# Patient Record
Sex: Male | Born: 1985
Health system: Southern US, Community
[De-identification: ages and names within clinical notes are randomized; demographics above are authoritative.]

## PROBLEM LIST (undated history)

## (undated) DIAGNOSIS — R55 Syncope and collapse: Secondary | ICD-10-CM

## (undated) DIAGNOSIS — K219 Gastro-esophageal reflux disease without esophagitis: Secondary | ICD-10-CM

## (undated) DIAGNOSIS — M549 Dorsalgia, unspecified: Secondary | ICD-10-CM

## (undated) HISTORY — DX: Syncope and collapse: R55

## (undated) HISTORY — PX: LEG SURGERY: SHX1003

## (undated) HISTORY — PX: WISDOM TOOTH EXTRACTION: SHX21

## (undated) HISTORY — DX: Gastro-esophageal reflux disease without esophagitis: K21.9

---
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
## 2012-10-10 ENCOUNTER — Telehealth: Payer: Self-pay | Admitting: General Practice

## 2012-10-10 NOTE — Telephone Encounter (Signed)
Wants to set up appt with you for next week concerning back pain. Please advice patient would like a call back from you if possible.

## 2012-10-10 NOTE — Telephone Encounter (Signed)
I can see him on 10/19/12 at 11:30.  Let me know if anything needs to be done before that appt.

## 2012-10-10 NOTE — Telephone Encounter (Signed)
Pt called for Dr. Lorin Picket nurse did not say what he needed.

## 2012-10-11 ENCOUNTER — Telehealth: Payer: Self-pay | Admitting: Internal Medicine

## 2012-10-11 NOTE — Telephone Encounter (Signed)
Made appointment. Left message on patient cell to let him know his appointment time and date

## 2012-10-11 NOTE — Telephone Encounter (Signed)
Pt aware of appointment 12/5

## 2012-10-11 NOTE — Telephone Encounter (Signed)
Left message for patient to return call.

## 2012-10-19 ENCOUNTER — Ambulatory Visit (INDEPENDENT_AMBULATORY_CARE_PROVIDER_SITE_OTHER): Payer: BC Managed Care – PPO | Admitting: Internal Medicine

## 2012-10-19 ENCOUNTER — Encounter: Payer: Self-pay | Admitting: Internal Medicine

## 2012-10-19 VITALS — BP 118/62 | HR 68 | Temp 98.8°F | Ht 72.0 in | Wt 265.2 lb

## 2012-10-19 DIAGNOSIS — M549 Dorsalgia, unspecified: Secondary | ICD-10-CM

## 2012-10-19 MED ORDER — ETODOLAC 400 MG PO TABS: 400.0000 mg | ORAL_TABLET | Freq: Two times a day (BID) | ORAL | Status: DC | PRN

## 2012-10-22 ENCOUNTER — Encounter: Payer: Self-pay | Admitting: Internal Medicine

## 2012-10-22 NOTE — Progress Notes (Signed)
  Subjective:    Patient ID: Ian Pineda, male    DOB: 12/20/1985, 26 y.o.   MRN: [id number]  HPI 25 year old male who comes in today as a work in with concerns regarding right side back pain.  Intermittently problem off and on.  States he woke three weeks ago and back was hurting.  Has been hurting since.  Stretches help.  Taking advil and tylenol prn.  No pain radiating down the legs.  No numbness or tingling.  No urinary or bowel change.    Review of Systems Patient denies any headache, lightheadedness or dizziness.  No chest pain, tightness or palpitations.  No increased shortness of breath, cough or congestion.  No nausea or vomiting.  No abdominal pain or cramping.  No bowel change.  No urine change.        Objective:   Physical Exam Filed Vitals:   10/19/12 1130  BP: 118/62  Pulse: 68  Temp: 98.8 F (55.94 C)   26 year old male in no acute distress. NECK:  Supple, nontender.    HEART:  Appears to be regular. LUNGS:  Without crackles or wheezing audible.  Respirations even and unlabored.  ABDOMEN:  Soft, nontender.  No audible abdominal bruit.   EXTREMITIES:  No increased edema to be present.  MSK.  No back tenderness to palpation.  Negative straight leg raise.  Motor strength equal bilateral lower extremities.  Left paraspinus muscle tightness compared to previous.                      Assessment & Plan:  BACK PAIN.  Felt to be more muscular in origin.  Increased muscle tightness as outlined.  No radicular symptoms.  Advil and tylenol helps some.  Will treat with Lodine 400mg  bid prn.  Hold on xray.  Follow closely.  Will notify me if persistent problems.    GI.  Bowels doing well. Now.  Had colonoscopy.  Obtain results.    ERECTILE DYSFUNCTION.  Intermittent problem.  Have checked testosterone level in the past and was normal per report.  Obtain records.  Works seven days a week most days.  Fatigue may be contributing.  Obtain records from outside to review.   HEALTH  MAINTENANCE.  Obtain outside records for review.  Will get him back in for physical exam.  I spent 25 minutes with him and more than 50% of the time was spent in consultation regarding the above.

## 2013-01-22 ENCOUNTER — Ambulatory Visit: Payer: Self-pay | Admitting: Internal Medicine

## 2013-04-20 ENCOUNTER — Encounter: Payer: BC Managed Care – PPO | Admitting: Internal Medicine

## 2013-06-19 ENCOUNTER — Encounter: Payer: Self-pay | Admitting: Internal Medicine

## 2013-06-19 ENCOUNTER — Ambulatory Visit (INDEPENDENT_AMBULATORY_CARE_PROVIDER_SITE_OTHER): Payer: BC Managed Care – PPO | Admitting: Internal Medicine

## 2013-06-19 ENCOUNTER — Ambulatory Visit (INDEPENDENT_AMBULATORY_CARE_PROVIDER_SITE_OTHER)
Admission: RE | Admit: 2013-06-19 | Discharge: 2013-06-19 | Disposition: A | Discharge status: Home / Self Care | Payer: BC Managed Care – PPO | Source: Ambulatory Visit | Attending: Internal Medicine | Admitting: Internal Medicine

## 2013-06-19 VITALS — BP 110/68 | HR 78 | Temp 98.6°F | Ht 72.25 in | Wt 271.5 lb

## 2013-06-19 DIAGNOSIS — M549 Dorsalgia, unspecified: Secondary | ICD-10-CM

## 2013-06-19 MED ORDER — ETODOLAC 400 MG PO TABS: 400.0000 mg | ORAL_TABLET | Freq: Two times a day (BID) | ORAL | Status: DC | PRN

## 2013-06-20 ENCOUNTER — Encounter: Payer: Self-pay | Admitting: Internal Medicine

## 2013-06-20 NOTE — Progress Notes (Signed)
  Subjective:    Patient ID: Ian Pineda, male    DOB: 28-Oct-1986, 27 y.o.   MRN: [id number]  HPI 27 year old male who comes in today for his complete physical exam.  Still working a lot of hours.  It is some better.  Some fatigue related to this.  Planning to get married in a couple of weeks.  Overall he feels he is doing well except for a recent back flare.  I saw him in 12/13 for back pain.  This improved and resolved, until a recent flare.  Describes as low back pain.  No radiation.  Good posture helps.  Physical therapy and chiropractor visits helped previously.  Worse in the am.  No abdominal pain or urinary symptoms.  Breathing stable.     Past Medical History  Diagnosis Date  . Syncope     one episode worked up (cardiology and endocrinology).  felt to be related to hypoglycemia    Review of Systems Patient denies any headache, lightheadedness or dizziness. No sinus or allergy symptoms.   No chest pain, tightness or palpitations.  No increased shortness of breath, cough or congestion.  No nausea or vomiting.  No abdominal pain or cramping.  No bowel change.  No urine change.  Back pain as outlined.         Objective:   Physical Exam  Filed Vitals:   06/19/13 1507  BP: 110/68  Pulse: 78  Temp: 98.6 F (52 C)   27 year old male in no acute distress.  HEENT:  Nares - clear.  Oropharynx - without lesions. NECK:  Supple.  Nontender.  No audible carotid bruit.  HEART:  Appears to be regular.   LUNGS:  No crackles or wheezing audible.  Respirations even and unlabored.   RADIAL PULSE:  Equal bilaterally.  ABDOMEN:  Soft.  Nontender.  Bowel sounds present and normal.  No audible abdominal bruit.  GU:  Not performed.   EXTREMITIES:  No increased edema present.  DP pulses palpable and equal bilaterally.     BACK:  Non tender to palpation.  Negative straight leg raise.   Pain localized to the right paraspinous muscle.        Assessment & Plan:  BACK PAIN.  Felt to be more muscular  in origin.  Increased muscle tightness as outlined.  No radicular symptoms.   Will treat with Lodine 400mg  bid prn.  This helped previously and he tolerated.  Check L-S spine xray.   Discussed physical therapy.  He discussed chiropractor.  Follow.  Let me know if symptoms persist or do not resolve.    GI.  Bowels doing well. Now.  Had colonoscopy.    HEALTH MAINTENANCE.  Physical today.    I spent 25 minutes with the patient and more than 50% of the time was spent in consultation regarding the above.

## 2013-07-12 ENCOUNTER — Other Ambulatory Visit: Payer: Self-pay | Admitting: Internal Medicine

## 2013-07-26 ENCOUNTER — Telehealth: Payer: Self-pay | Admitting: Internal Medicine

## 2013-07-26 NOTE — Telephone Encounter (Signed)
Need to notify pt that I am seeing pts now and that I have two meetings this evening.   I will probably not be able to call him this pm.  Need to know if anything urgent or problems - or can he wait.

## 2013-07-26 NOTE — Telephone Encounter (Signed)
Called pt and left message on his cell.

## 2013-07-26 NOTE — Telephone Encounter (Signed)
Pt is needing call back. He would not give me any details he just wanted Dr. Lorin Picket to give him a call back ???

## 2013-07-26 NOTE — Telephone Encounter (Signed)
Pt notified & states that it is not urgent. Would be better to call tomorrow after 3:30 (when he gets off work)

## 2013-07-28 NOTE — Telephone Encounter (Signed)
Called and left message on his cell phone.

## 2013-07-29 NOTE — Telephone Encounter (Signed)
Called pt.  Discussed with him regarding problems with erections.  He had no problems on his honeymoon.  When he returned noticed some problems with sustaining erections.  Now having no problems.  We discussed this at length.  Discussed possible etiologies.  Since it is better now, he desires no further w/up at this point.  Follow.  Will notify me if any problems or if he desires further w/up.

## 2013-09-20 ENCOUNTER — Ambulatory Visit (INDEPENDENT_AMBULATORY_CARE_PROVIDER_SITE_OTHER): Payer: BC Managed Care – PPO | Admitting: Internal Medicine

## 2013-09-20 ENCOUNTER — Encounter: Payer: Self-pay | Admitting: Internal Medicine

## 2013-09-20 VITALS — BP 124/78 | HR 64 | Temp 98.4°F | Wt 268.0 lb

## 2013-09-20 DIAGNOSIS — M549 Dorsalgia, unspecified: Secondary | ICD-10-CM

## 2013-09-20 MED ORDER — ETODOLAC 400 MG PO TABS: 400.0000 mg | ORAL_TABLET | Freq: Two times a day (BID) | ORAL | Status: DC | PRN

## 2013-09-20 NOTE — Progress Notes (Signed)
Pre-visit discussion using our clinic review tool. No additional management support is needed unless otherwise documented below in the visit note.  

## 2013-09-23 ENCOUNTER — Encounter: Payer: Self-pay | Admitting: Internal Medicine

## 2013-09-23 NOTE — Progress Notes (Signed)
  Subjective:    Patient ID: Ian Pineda, male    DOB: November 01, 1986, 27 y.o.   MRN: [id number]  HPI 27 year old male who comes in today for a schedule follow up.   Still working a lot of hours.  It is some better.  Some fatigue related to this.  Is married now.   No abdominal pain or urinary symptoms.  Breathing stable.  Back is doing better.  Not requiring Lodine now.  Request to have rx if needed.  The previous problems he was having with sustaining an erection have resolved.  Doing well.     Past Medical History  Diagnosis Date  . Syncope     one episode worked up (cardiology and endocrinology).  felt to be related to hypoglycemia    Review of Systems Patient denies any headache, lightheadedness or dizziness. No sinus or allergy symptoms.   No chest pain, tightness or palpitations.  No increased shortness of breath, cough or congestion.  No nausea or vomiting.  No abdominal pain or cramping.  No bowel change.  No urine change.  Back better.          Objective:   Physical Exam  Filed Vitals:   09/20/13 1600  BP: 124/78  Pulse: 64  Temp: 98.4 F (39.44 C)   27 year old male in no acute distress.  HEENT:  Nares - clear.  Oropharynx - without lesions. NECK:  Supple.  Nontender.  No audible carotid bruit.  HEART:  Appears to be regular.   LUNGS:  No crackles or wheezing audible.  Respirations even and unlabored.   RADIAL PULSE:  Equal bilaterally.  ABDOMEN:  Soft.  Nontender.  Bowel sounds present and normal.  No audible abdominal bruit.      BACK:  Non tender to palpation.  EXTREMITIES:  No increased edema.         Assessment & Plan:  BACK PAIN.  Continue therapy exercises.  Not requiring lodine now.  rx given if needed.  Doing better.     GI.  Bowels doing well. Now.  Had colonoscopy.    HEALTH MAINTENANCE.  Physical last visit.

## 2013-10-02 ENCOUNTER — Telehealth: Payer: Self-pay | Admitting: Internal Medicine

## 2013-10-02 NOTE — Telephone Encounter (Signed)
States pharmacy did not receive rx for etodolac.

## 2013-10-03 ENCOUNTER — Other Ambulatory Visit: Payer: Self-pay | Admitting: *Deleted

## 2013-10-03 MED ORDER — ETODOLAC 400 MG PO TABS: 400.0000 mg | ORAL_TABLET | Freq: Two times a day (BID) | ORAL | Status: DC | PRN

## 2013-10-03 NOTE — Telephone Encounter (Signed)
Rx resent electronically

## 2013-11-30 ENCOUNTER — Telehealth: Payer: Self-pay | Admitting: Internal Medicine

## 2013-11-30 ENCOUNTER — Encounter: Payer: Self-pay | Admitting: Internal Medicine

## 2013-11-30 ENCOUNTER — Ambulatory Visit (INDEPENDENT_AMBULATORY_CARE_PROVIDER_SITE_OTHER): Payer: BC Managed Care – PPO | Admitting: Internal Medicine

## 2013-11-30 VITALS — BP 130/74 | HR 102 | Temp 98.6°F | Wt 266.8 lb

## 2013-11-30 DIAGNOSIS — J019 Acute sinusitis, unspecified: Secondary | ICD-10-CM

## 2013-11-30 MED ORDER — AMOXICILLIN 875 MG PO TABS: 875.0000 mg | ORAL_TABLET | Freq: Two times a day (BID) | ORAL | Status: DC

## 2013-11-30 NOTE — Patient Instructions (Signed)

## 2013-11-30 NOTE — Telephone Encounter (Signed)
Patient Information:  Caller Name: Ian Pineda  Phone: [phone]  Patient: Ian Pineda, Ian Pineda  Gender: Male  DOB: 1986-05-19  Age: 28 Years  PCP: Dale DurhamScott, Charlene  Office Follow Up:  Does the office need to follow up with this patient?: No  Instructions For The Office: N/A  RN Note:  No appts available in [place] office, [facility] 1400 11/30/13 with Nicki Reaperegina Baity, NP.  Symptoms  Reason For Call & Symptoms: Pt states he has had cold symptoms with productive cough, yellow to brown in color.  Reviewed Health History In EMR: Yes  Reviewed Medications In EMR: Yes  Reviewed Allergies In EMR: Yes  Reviewed Surgeries / Procedures: Yes  Date of Onset of Symptoms: 11/14/2013  Guideline(s) Used:  Cough  Disposition Per Guideline:   See Today in Office  Reason For Disposition Reached:   Coughing up rusty-colored (reddish-brown) or blood-tinged sputum  Advice Given:  Call Back If:  You become worse.  Patient Will Follow Care Advice:  YES  Appointment Scheduled:  11/29/2013 14:00:00 Appointment Scheduled Provider:  Other

## 2013-11-30 NOTE — Progress Notes (Signed)
HPI  Pt presents to the clinic today with c/o cough, chest congestion, ear fullness, and facial pain/pressure. This started about 2 weeks ago. He is blowing thick green/brown mucous out of his nose. He is not sure if he has had fevers but he has had chills and body aches. He has tried Mucinex, tylenol cold and sinus, Sudafed, robitussion with out relief. He has had sick contacts. He does not smoke but is around people who do smoke.  Review of Systems      Past Medical History  Diagnosis Date  . Syncope     one episode worked up (cardiology and endocrinology).  felt to be related to hypoglycemia    Family History  Problem Relation Age of Onset  . Adopted: Yes    History   Social History  . Marital Status: Single    Spouse Name: N/A    Number of Children: N/A  . Years of Education: N/A   Occupational History  . Not on file.   Social History Main Topics  . Smoking status: Former Games developermoker  . Smokeless tobacco: Never Used  . Alcohol Use: No  . Drug Use: No  . Sexual Activity: Not on file   Other Topics Concern  . Not on file   Social History Narrative  . No narrative on file    No Known Allergies   Constitutional: Positive headache, fatigue and fever. Denies abrupt weight changes.  HEENT:  Positive sore throat, nasal congestion. Denies eye redness, eye pain, pressure behind the eyes, facial pain, ear pain, ringing in the ears, wax buildup, runny nose or bloody nose. Respiratory: Positive cough. Denies difficulty breathing or shortness of breath.  Cardiovascular: Denies chest pain, chest tightness, palpitations or swelling in the hands or feet.   No other specific complaints in a complete review of systems (except as listed in HPI above).  Objective:   BP 130/74  Pulse 102  Temp(Src) 98.6 F (37 C) (Oral)  Wt 266 lb 12 oz (120.997 kg)  SpO2 98% Wt Readings from Last 3 Encounters:  11/30/13 266 lb 12 oz (120.997 kg)  09/20/13 268 lb (121.564 kg)  06/19/13 271  lb 8 oz (123.152 kg)     General: Appears his stated age, well developed, well nourished in NAD. HEENT: Head: normal shape and size, bilateral frontal sinus tenderness; Eyes: sclera white, no icterus, conjunctiva pink, PERRLA and EOMs intact; Ears: Tm's gray and intact, normal light reflex; Nose: mucosa pink and moist, septum midline; Throat/Mouth: + PND. Teeth present, mucosa erythematous and moist, no exudate noted, no lesions or ulcerations noted.  Neck: Mild cervical lymphadenopathy. Neck supple, trachea midline. No massses, lumps or thyromegaly present.  Cardiovascular: Normal rate and rhythm. S1,S2 noted.  No murmur, rubs or gallops noted. No JVD or BLE edema. No carotid bruits noted. Pulmonary/Chest: Normal effort and positive vesicular breath sounds. No respiratory distress. No wheezes, rales or ronchi noted.      Assessment & Plan:   Acute Sinusitis:  Get some rest and drink plenty of water Given duration of symptoms will treat with Amoxil 875 mgBID x 10 days Stop all the other OTC medications  RTC as needed or if symptoms persist.

## 2013-11-30 NOTE — Progress Notes (Signed)
Pre-visit discussion using our clinic review tool. No additional management support is needed unless otherwise documented below in the visit note.  

## 2013-12-21 ENCOUNTER — Ambulatory Visit (INDEPENDENT_AMBULATORY_CARE_PROVIDER_SITE_OTHER): Payer: BC Managed Care – PPO | Admitting: Internal Medicine

## 2013-12-21 ENCOUNTER — Encounter: Payer: Self-pay | Admitting: Internal Medicine

## 2013-12-21 ENCOUNTER — Ambulatory Visit: Payer: BC Managed Care – PPO | Admitting: Internal Medicine

## 2013-12-21 VITALS — BP 128/82 | HR 96 | Temp 99.0°F | Resp 18 | Wt 267.0 lb

## 2013-12-21 DIAGNOSIS — N529 Male erectile dysfunction, unspecified: Secondary | ICD-10-CM | POA: Insufficient documentation

## 2013-12-21 DIAGNOSIS — R6889 Other general symptoms and signs: Secondary | ICD-10-CM

## 2013-12-21 LAB — POCT INFLUENZA A/B
INFLUENZA B, POC: NEGATIVE
Influenza A, POC: NEGATIVE

## 2013-12-21 MED ORDER — OSELTAMIVIR PHOSPHATE 75 MG PO CAPS: 75.0000 mg | ORAL_CAPSULE | Freq: Two times a day (BID) | ORAL | Status: DC

## 2013-12-21 MED ORDER — GUAIFENESIN-CODEINE 100-10 MG/5ML PO SYRP: 5.0000 mL | ORAL_SOLUTION | Freq: Three times a day (TID) | ORAL | Status: DC | PRN

## 2013-12-21 NOTE — Assessment & Plan Note (Signed)
History and exam consistnet with flu. Regardless of negative rapid influenza test,  Treating with tamiflu ,  Motrin and cheritussin .

## 2013-12-21 NOTE — Progress Notes (Signed)
Patient ID: Ian Pineda, male   DOB: 10-21-1986, 28 y.o.   MRN: [id number]  Patient Active Problem List   Diagnosis Date Noted  . Flu-like symptoms 12/21/2013    Subjective:  CC:   Chief Complaint  Patient presents with  . Cough    chills , fever for about 24 hours.  . Generalized Body Aches    HPI:   Ian Pineda is a 28 y.o. male who presents for evaluation of fever, body aches. He was [facility] on Jan 16 for two week history of symptoms consistent with bacterial sinusitis with amoxicillin. Symptoms were slow to resolve but did  Resolve by Jan 26 th, when he had finished the antibiotic.  Felt great for a few days,  Then Woke up 3 am this morning with a drenching sweat, body aches,  Moderately severe headache.  No neck pain, rash or GI symptoms . Had another drenching sweat during work today.  Has taken BC powders since then.  No sinus pain or ear pain.  Works at an [employer].  No direct contact with public or known exposure to flu.    Past Medical History  Diagnosis Date  . Syncope     one episode worked up (cardiology and endocrinology).  felt to be related to hypoglycemia    Past Surgical History  Procedure Laterality Date  . Ankle surgery  2003  . Wisdom tooth extraction         The following portions of the patient's history were reviewed and updated as appropriate: Allergies, current medications, and problem list.    Review of Systems:   Patient denies headache, fevers, malaise, unintentional weight loss, skin rash, eye pain, sinus congestion and sinus pain, sore throat, dysphagia,  hemoptysis , cough, dyspnea, wheezing, chest pain, palpitations, orthopnea, edema, abdominal pain, nausea, melena, diarrhea, constipation, flank pain, dysuria, hematuria, urinary  Frequency, nocturia, numbness, tingling, seizures,  Focal weakness, Loss of consciousness,  Tremor, insomnia, depression, anxiety, and suicidal ideation.     History   Social History   . Marital Status: Single    Spouse Name: N/A    Number of Children: N/A  . Years of Education: N/A   Occupational History  . Not on file.   Social History Main Topics  . Smoking status: Former Games developer  . Smokeless tobacco: Never Used  . Alcohol Use: No  . Drug Use: No  . Sexual Activity: Not on file   Other Topics Concern  . Not on file   Social History Narrative  . No narrative on file    Objective:  Filed Vitals:   12/21/13 1140  BP: 128/82  Pulse: 96  Temp: 99 F (37.2 C)  Resp: 18     General appearance: alert, cooperative and appears stated age Ears: normal TM's and external ear canals both ears Throat: lips, mucosa, and tongue normal; teeth and gums normal Neck: no adenopathy, no carotid bruit, supple, symmetrical, trachea midline and thyroid not enlarged, symmetric, no tenderness/mass/nodules Back: symmetric, no curvature. ROM normal. No CVA tenderness. Lungs: clear to auscultation bilaterally Heart: regular rate and rhythm, S1, S2 normal, no murmur, click, rub or gallop Abdomen: soft, non-tender; bowel sounds normal; no masses,  no organomegaly Pulses: 2+ and symmetric Skin: Skin color, texture, turgor normal. No rashes or lesions Lymph nodes: Cervical, supraclavicular, and axillary nodes normal.  Assessment and Plan:  Flu-like symptoms History and exam consistnet with flu. Regardless of negative rapid influenza test,  Treating with tamiflu ,  Motrin and cheritussin .    Updated Medication List Outpatient Encounter Prescriptions as of 12/21/2013  Medication Sig  . amoxicillin (AMOXIL) 875 MG tablet Take 1 tablet (875 mg total) by mouth 2 (two) times daily.  Marland Kitchen. guaiFENesin-codeine (CHERATUSSIN AC) 100-10 MG/5ML syrup Take 5 mLs by mouth 3 (three) times daily as needed for cough.  Marland Kitchen. oseltamivir (TAMIFLU) 75 MG capsule Take 1 capsule (75 mg total) by mouth 2 (two) times daily.     Orders Placed This Encounter  Procedures  . POCT Influenza A/B    No  Follow-up on file.

## 2013-12-21 NOTE — Progress Notes (Signed)
Pre-visit discussion using our clinic review tool. No additional management support is needed unless otherwise documented below in the visit note.  

## 2013-12-21 NOTE — Patient Instructions (Signed)
Your symptoms suggest you have the flu. I am recommending you start Tamiflu today , take it twice daily for 5 days  You can use the cheratussin for nighttime cough,  If the nyquil doesn't work  [employer] powders are helpful for the fevers, body aches  And headache but too many can cause a gastric ulcer  If you need to take more than two daily,  Substitute Advil/Motrin or tylenol  Benadryl works well for post nasal drip that os causing your laryngitis

## 2014-01-15 ENCOUNTER — Encounter: Payer: Self-pay | Admitting: Internal Medicine

## 2014-01-15 ENCOUNTER — Ambulatory Visit (INDEPENDENT_AMBULATORY_CARE_PROVIDER_SITE_OTHER): Payer: BC Managed Care – PPO | Admitting: Internal Medicine

## 2014-01-15 VITALS — BP 110/66 | HR 57 | Temp 98.4°F | Resp 16 | Ht 72.25 in | Wt 273.0 lb

## 2014-01-15 DIAGNOSIS — R5383 Other fatigue: Secondary | ICD-10-CM

## 2014-01-15 DIAGNOSIS — N529 Male erectile dysfunction, unspecified: Secondary | ICD-10-CM

## 2014-01-15 DIAGNOSIS — R5381 Other malaise: Secondary | ICD-10-CM

## 2014-01-15 NOTE — Progress Notes (Signed)
Pre visit review using our clinic review tool, if applicable. No additional management support is needed unless otherwise documented below in the visit note. 

## 2014-01-16 LAB — CBC WITH DIFFERENTIAL/PLATELET
BASOS PCT: 0.3 % (ref 0.0–3.0)
Basophils Absolute: 0 10*3/uL (ref 0.0–0.1)
EOS ABS: 0.2 10*3/uL (ref 0.0–0.7)
Eosinophils Relative: 1.9 % (ref 0.0–5.0)
HEMATOCRIT: 43 % (ref 39.0–52.0)
HEMOGLOBIN: 14.4 g/dL (ref 13.0–17.0)
Lymphocytes Relative: 21.1 % (ref 12.0–46.0)
Lymphs Abs: 2.3 10*3/uL (ref 0.7–4.0)
MCHC: 33.6 g/dL (ref 30.0–36.0)
MCV: 88.4 fl (ref 78.0–100.0)
Monocytes Absolute: 0.5 10*3/uL (ref 0.1–1.0)
Monocytes Relative: 4.9 % (ref 3.0–12.0)
NEUTROS ABS: 7.9 10*3/uL — AB (ref 1.4–7.7)
Neutrophils Relative %: 71.8 % (ref 43.0–77.0)
Platelets: 221 10*3/uL (ref 150.0–400.0)
RBC: 4.86 Mil/uL (ref 4.22–5.81)
RDW: 13.1 % (ref 11.5–14.6)
WBC: 11 10*3/uL — ABNORMAL HIGH (ref 4.5–10.5)

## 2014-01-16 LAB — COMPREHENSIVE METABOLIC PANEL
ALT: 31 U/L (ref 0–53)
AST: 21 U/L (ref 0–37)
Albumin: 4.2 g/dL (ref 3.5–5.2)
Alkaline Phosphatase: 54 U/L (ref 39–117)
BUN: 12 mg/dL (ref 6–23)
CALCIUM: 9.2 mg/dL (ref 8.4–10.5)
CO2: 29 meq/L (ref 19–32)
CREATININE: 0.8 mg/dL (ref 0.4–1.5)
Chloride: 102 mEq/L (ref 96–112)
GFR: 122.7 mL/min (ref >60.00)
Glucose, Bld: 88 mg/dL (ref 70–99)
Potassium: 3.7 mEq/L (ref 3.5–5.1)
Sodium: 137 mEq/L (ref 135–145)
Total Bilirubin: 0.9 mg/dL (ref 0.3–1.2)
Total Protein: 6.5 g/dL (ref 6.0–8.3)

## 2014-01-16 LAB — TSH: TSH: 1.07 u[IU]/mL (ref 0.35–5.50)

## 2014-01-16 LAB — TESTOSTERONE: TESTOSTERONE: 294.49 ng/dL — AB (ref 350.00–890.00)

## 2014-01-19 ENCOUNTER — Encounter: Payer: Self-pay | Admitting: Internal Medicine

## 2014-01-19 NOTE — Progress Notes (Signed)
  Subjective:    Patient ID: Ian Pineda, male    DOB: 1986-08-27, 28 y.o.   MRN: [id number]  HPI 28 year old male who comes in today for a schedule follow up.   Still working a lot of hours.  It is some better.  Some fatigue related to this.  No abdominal pain or urinary symptoms.  Breathing stable.  Back is doing better.  Not requiring Lodine now. He did slip and fall recently.   Injured his neck and shoulder.  Hurt his left elbow.  Better now.  No headache or dizziness.  He has had problems intermittently with sustaining erections.  He states has noticed occurring again.  Also reports that it is rare that he has a morning erection.  Will be better at times.     Past Medical History  Diagnosis Date  . Syncope     one episode worked up (cardiology and endocrinology).  felt to be related to hypoglycemia    Review of Systems Patient denies any headache, lightheadedness or dizziness. No sinus or allergy symptoms.   No chest pain, tightness or palpitations.  No increased shortness of breath, cough or congestion.  No nausea or vomiting.  No abdominal pain or cramping.  No bowel change.  No urine change.  Back better.          Objective:   Physical Exam  Filed Vitals:   01/15/14 1512  BP: 110/66  Pulse: 57  Temp: 98.4 F (36.9 C)  Resp: 1516   28 year old male in no acute distress.  HEENT:  Nares - clear.  Oropharynx - without lesions. NECK:  Supple.  Nontender.  No audible carotid bruit.  HEART:  Appears to be regular.   LUNGS:  No crackles or wheezing audible.  Respirations even and unlabored.   RADIAL PULSE:  Equal bilaterally.  ABDOMEN:  Soft.  Nontender.  Bowel sounds present and normal.  No audible abdominal bruit.  GU:  Normal descended testicles.  No palpable testicular nodules.    EXTREMITIES:  No increased edema present.  DP pulses palpable and equal bilaterally.          Assessment & Plan:  BACK PAIN.  Continue therapy exercises.   Doing better.     GI.  Bowels doing  well. Now.  Had colonoscopy.    HEALTH MAINTENANCE.  Physical 06/19/13.

## 2014-01-19 NOTE — Assessment & Plan Note (Signed)
Discussed at length with him today.  Would like his testosterone level rechecked.  Will check metabolic panel, thyroid function and testosterone.

## 2014-01-24 ENCOUNTER — Other Ambulatory Visit: Payer: Self-pay | Admitting: Internal Medicine

## 2014-01-24 DIAGNOSIS — D72829 Elevated white blood cell count, unspecified: Secondary | ICD-10-CM

## 2014-01-24 NOTE — Progress Notes (Signed)
Order placed for f/u [facility].   

## 2014-01-25 ENCOUNTER — Telehealth: Payer: Self-pay | Admitting: Internal Medicine

## 2014-01-25 DIAGNOSIS — N529 Male erectile dysfunction, unspecified: Secondary | ICD-10-CM

## 2014-01-25 NOTE — Telephone Encounter (Signed)
Order placed for urology referral.  

## 2014-02-07 ENCOUNTER — Other Ambulatory Visit (INDEPENDENT_AMBULATORY_CARE_PROVIDER_SITE_OTHER): Payer: BC Managed Care – PPO

## 2014-02-07 DIAGNOSIS — D72829 Elevated white blood cell count, unspecified: Secondary | ICD-10-CM

## 2014-02-08 LAB — CBC WITH DIFFERENTIAL/PLATELET
BASOS PCT: 0.9 % (ref 0.0–3.0)
Basophils Absolute: 0.1 10*3/uL (ref 0.0–0.1)
EOS PCT: 2.4 % (ref 0.0–5.0)
Eosinophils Absolute: 0.2 10*3/uL (ref 0.0–0.7)
HCT: 42.4 % (ref 39.0–52.0)
HEMOGLOBIN: 14.3 g/dL (ref 13.0–17.0)
LYMPHS PCT: 24.8 % (ref 12.0–46.0)
Lymphs Abs: 2.1 10*3/uL (ref 0.7–4.0)
MCHC: 33.6 g/dL (ref 30.0–36.0)
MCV: 88.2 fl (ref 78.0–100.0)
MONOS PCT: 5.4 % (ref 3.0–12.0)
Monocytes Absolute: 0.5 10*3/uL (ref 0.1–1.0)
Neutro Abs: 5.6 10*3/uL (ref 1.4–7.7)
Neutrophils Relative %: 66.5 % (ref 43.0–77.0)
Platelets: 237 10*3/uL (ref 150.0–400.0)
RBC: 4.82 Mil/uL (ref 4.22–5.81)
RDW: 12.7 % (ref 11.5–14.6)
WBC: 8.5 10*3/uL (ref 4.5–10.5)

## 2014-02-11 ENCOUNTER — Encounter: Payer: Self-pay | Admitting: *Deleted

## 2014-02-26 ENCOUNTER — Other Ambulatory Visit: Payer: Self-pay | Admitting: Internal Medicine

## 2014-02-27 NOTE — Telephone Encounter (Signed)
Electronic Rx request, Please advise. 

## 2014-02-27 NOTE — Telephone Encounter (Signed)
Refilled lodine #30 with no refills.

## 2014-03-13 ENCOUNTER — Ambulatory Visit: Payer: Self-pay | Admitting: Family Medicine

## 2014-03-13 LAB — RAPID STREP-A WITH REFLX: MICRO TEXT REPORT: NEGATIVE

## 2014-03-14 ENCOUNTER — Ambulatory Visit: Payer: BC Managed Care – PPO | Admitting: Adult Health

## 2014-03-16 LAB — BETA STREP CULTURE(ARMC)

## 2014-05-13 ENCOUNTER — Ambulatory Visit: Payer: Self-pay

## 2014-05-13 LAB — RAPID STREP-A WITH REFLX: MICRO TEXT REPORT: NEGATIVE

## 2014-05-16 LAB — BETA STREP CULTURE(ARMC)

## 2014-06-24 ENCOUNTER — Encounter: Payer: BC Managed Care – PPO | Admitting: Internal Medicine

## 2014-08-30 ENCOUNTER — Encounter: Payer: BC Managed Care – PPO | Admitting: Internal Medicine

## 2014-10-24 ENCOUNTER — Encounter: Payer: Self-pay | Admitting: Internal Medicine

## 2014-10-24 ENCOUNTER — Ambulatory Visit (INDEPENDENT_AMBULATORY_CARE_PROVIDER_SITE_OTHER): Payer: BC Managed Care – PPO | Admitting: Internal Medicine

## 2014-10-24 ENCOUNTER — Encounter: Payer: Self-pay | Admitting: Endocrinology

## 2014-10-24 ENCOUNTER — Encounter (INDEPENDENT_AMBULATORY_CARE_PROVIDER_SITE_OTHER): Payer: Self-pay

## 2014-10-24 ENCOUNTER — Ambulatory Visit (INDEPENDENT_AMBULATORY_CARE_PROVIDER_SITE_OTHER): Payer: BC Managed Care – PPO | Admitting: Endocrinology

## 2014-10-24 VITALS — BP 124/70 | HR 63 | Temp 98.1°F | Ht 72.0 in | Wt 254.8 lb

## 2014-10-24 VITALS — HR 104 | Ht 72.0 in | Wt 255.2 lb

## 2014-10-24 DIAGNOSIS — F439 Reaction to severe stress, unspecified: Secondary | ICD-10-CM

## 2014-10-24 DIAGNOSIS — R7989 Other specified abnormal findings of blood chemistry: Secondary | ICD-10-CM

## 2014-10-24 DIAGNOSIS — Z658 Other specified problems related to psychosocial circumstances: Secondary | ICD-10-CM

## 2014-10-24 DIAGNOSIS — M545 Low back pain: Secondary | ICD-10-CM

## 2014-10-24 DIAGNOSIS — E291 Testicular hypofunction: Secondary | ICD-10-CM

## 2014-10-24 DIAGNOSIS — R079 Chest pain, unspecified: Secondary | ICD-10-CM

## 2014-10-24 DIAGNOSIS — N528 Other male erectile dysfunction: Secondary | ICD-10-CM

## 2014-10-24 NOTE — Progress Notes (Signed)
Pre visit review using our clinic review tool, if applicable. No additional management support is needed unless otherwise documented below in the visit note. 

## 2014-10-24 NOTE — Progress Notes (Signed)
Subjective:    Patient ID: Ian Pineda, male    DOB: 08/10/1986, 28 y.o.   MRN: [id number]  HPI 28 year old male who comes in today for his physical exam.    Still working a lot of hours.  It is some better.  Some fatigue related to this.  No abdominal pain or urinary symptoms.  Breathing stable.  Back is doing better.  Has adjusted his diet. Lost some weight.  Back better.    No headache or dizziness.  He has had problems intermittently with sustaining erections, etc.  Was found to have low testosterone level.  Seeing Dr Lonna CobbStoioff.  Started on clomid.  Was initially started on one tablet qod.  Symptoms improved.  Felt better.  Recently decreased to 1/2 tablet qod.  Does not feel as well.  Has a lot of questions regarding the low testosterone and why it is low.   Will notice some chest discomfort at times.  Relates it to increased stress. No chest pain or tightness with increased activity or exertion.  Increased stress with the low testosterone.      Past Medical History  Diagnosis Date  . Syncope     one episode worked up (cardiology and endocrinology).  felt to be related to hypoglycemia    Current Outpatient Prescriptions on File Prior to Visit  Medication Sig Dispense Refill  . etodolac (LODINE) 400 MG tablet TAKE (1) TABLET BY MOUTH TWICE A DAY AS NEEDED. 30 tablet 0   No current facility-administered medications on file prior to visit.    Review of Systems Patient denies any headache, lightheadedness or dizziness. No sinus or allergy symptoms.   No chest pain, tightness or palpitations with increased activity or exertion.  Some discomfort with increased anxiety.   No increased shortness of breath, cough or congestion.  No nausea or vomiting.  No acid reflux.  No abdominal pain or cramping.  No bowel change.  No urine change.  Back better.   Increased stress as outlined.       Objective:   Physical Exam  Filed Vitals:   10/24/14 1029  BP: 124/70  Pulse: 63  Temp: 98.1 F (5536.337 C)    28 year old male in no acute distress.  HEENT:  Nares - clear.  Oropharynx - without lesions. NECK:  Supple.  Nontender.  No audible carotid bruit.  HEART:  Appears to be regular.   LUNGS:  No crackles or wheezing audible.  Respirations even and unlabored.   RADIAL PULSE:  Equal bilaterally.  ABDOMEN:  Soft.  Nontender.  Bowel sounds present and normal.  No audible abdominal bruit.  GU:  Not performed.   Seeing urology now.   EXTREMITIES:  No increased edema present.  DP pulses palpable and equal bilaterally.          Assessment & Plan:  1. Chest pain, unspecified chest pain type Discomfort noted with increased anxiety.  No chest pain or tightness with increased activity or exertion.  - EKG 12-Lead revealed SR with no acute ischemic changes.  Discuss further w/up including stress testing, etc.  He prefers to monitor.  Will follow.  Notify me if persistent or reoccurring problems.    2. Low testosterone Found to have low testosterone. On clomid now.  Seeing Dr Lonna CobbStoioff.  Had a lot of questions regarding the etiology of low testosterone.  Discussed today.  Will refer to endocrinology.   - Ambulatory referral to Endocrinology  3. Low back pain, unspecified back  pain laterality, with sciatica presence unspecified Better after exercising,, diet adjustment and weight loss.    4. Stress Discussed with him today.  Will follow.  Refer to endocrinology.    5. GI.  Bowels doing well. Now.  Had colonoscopy.    HEALTH MAINTENANCE.  Physical today.

## 2014-10-24 NOTE — Patient Instructions (Addendum)
Stop Clomid after you run out of the current script this month.  Monitor your symptoms.    Return for a morning appointment in 2-3 months.  Will get morning labs at that time.

## 2014-10-24 NOTE — Progress Notes (Signed)
Patient ID: Ian Pineda, male   DOB: 11-24-1985, 28 y.o.   MRN: [id number]  Ian Pineda HPI: Ian CashBrandon Pineda is a 28 y.o.-year-old man, referred by his PCP, Ian S, MD , for evaluation and management of low testosterone.  The patient had been symptomatic with intermittent problems with ED for the past 2-3 years, and most recently more persistent symptoms since this year, 252015. 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 Given his interest in starting a family in the near future, he elected to try Clomiphene, which was started around April 2015 at 1 tablet every other day. His dose was adjusted back down to 0.5 tablet every other day roughly since July 2015. The patient was at his PCP appointment today and had several questions about his hypogonadism etiology and treatment going forward. His clomid script has ben very costly as well. He is here to discuss the diagnosis further. He has had symptom improvement with the clomid, however feels better on the higher dose of Clomid.   He admits for decreased libido at times. Has difficulty obtaining or maintaining an erection No trauma to testes, testicular irradiation or surgery No h/o of mumps orchitis/h/o autoimmune ds. No h/o cryptorchidism He grew and went through puberty like his peers No shrinking of testes. No very small testes (<5 ml) No incomplete/delayed sexual development     No breast discomfort/gynecomastia    No loss of body hair (axillary/pubic)/decreased need for shaving No height loss No abnormal sense of smell  No hot flushes No vision problems No worst HA of his life No FH of hypogonadism/infertility ( though family history is limited as he is adopted) Has not fathered any children so far- interested in starting family in the next 1-3 years No FH of hemochromatosis or pituitary tumors No excessive weight gain or loss. Lost some weight through lifestyle No chronic  diseases No chronic pain. Not on opiates, does not take steroids.  Occasional alcohol use No anabolic steroids use No herbal medicines Not on antidepressants  No prior hx CAD or blood clots I have reviewed his recent labs. Lab Results  Component Value Date   TESTOSTERONE 294.49* 01/15/2014   Testosterone level slightly low at 327 [phone]( [phone]) -02/2014 Normal SHBG, estradiol, free direct Testosterone 02/2014 LH normal 4.8, PRL normal 9.9 -02/2014  Lab Results  Component Value Date   TSH 1.07 01/15/2014      ROS: Review of Systems: [x]  complains of  [  ] denies General:   [ x ] Recent weight change [  ] Fatigue  [  ] Loss of appetite Eyes: [  ]  Vision Difficulty [  ]  Eye pain ENT: [  ]  Hearing difficulty [  ]  Difficulty Swallowing CVS: [  ] Chest pain [  ]  Palpitations/Irregular Heart beat [  ]  Shortness of breath lying flat [  ] Swelling of legs Resp: [  ] Frequent Cough [  ] Shortness of Breath  [  ]  Wheezing GI: [  ] Heartburn  [  ] Nausea or Vomiting  [  ] Diarrhea [  ] Constipation  [  ] Abdominal Pain GU: [  ]  Polyuria  [  ]  nocturia Bones/joints:  [  ]  Muscle aches  [  ] Joint Pain  [  ] Bone pain Skin/Hair/Nails: [  ]  Rash  [  ] New  stretch marks [  ]  Itching [  ] Hair loss [  ]  Excessive hair growth Reproduction: [ x ] Low sexual desire , [  ]  Women: Menstrual cycle problems [  ]  Women: Breast Discharge [  x] Men: Difficulty with erections [  ]  Men: Enlarged Breasts CNS: [  ] Frequent Headaches [  ] Blurry vision [  ] Tremors [  ] Seizures [  ] Loss of consciousness [  ] Localized weakness Endocrine: [  ]  Excess thirst [  ]  Feeling excessively hot [  ]  Feeling excessively cold Heme: [  ]  Easy bruising [  ]  Enlarged glands or lumps in neck Allergy: [  ]  Food allergies [  ] Environmental allergies   PE: Pulse 104  Ht 6' (1.829 m)  Wt 255 lb 4 oz (115.781 kg)  BMI 34.61 kg/m2  SpO2 98% Wt Readings from Last 3 Encounters:  10/24/14 255 lb 4 oz  (115.781 kg)  10/24/14 254 lb 12 oz (115.554 kg)  01/15/14 273 lb (123.832 kg)   HEENT: [place]/AT, EOMI, no icterus, no proptosis, no chemosis, no mild lid lag, no retraction, eyes close completely, gross normal VF testing on confrontation Neck: thyroid gland - smooth, non-tender, no erythema, no tracheal deviation; negative Pemberton'Pineda sign; no lymphadenopathy; no bruits Lungs: good air entry, clear bilaterally Heart: S1&S2 normal, regular rate & rhythm; no murmurs, rubs or gallops Abd: soft, NT, ND, no HSM, +BS Breast: mild gynaecomastia, no discharge Genital: deferred ( Urology notes reviewed for this) Ext: no tremor in hands bilaterally, no edema, 2+ DP/PT pulses, good muscle mass Neuro: normal gait, 2+ reflexes bilaterally, normal 5/5 strength, no proximal myopathy  Derm: no pretibial myxoedema/skin dryness   ASSESSMENT: 1. Low testosterone 2. ED  PLAN:  Problem List Items Addressed This Visit      Genitourinary   Erectile dysfunction    Could be secondary to low T levels. Plan as below.       Other   Low testosterone - Primary    The patient has symptoms of low Testosterone in combination with evidence of low total Testosterone on 2 prior occasions earlier this April 2015. Free Testosterone was normal at that time. Even though SHBG has been normal, it is also likely that low Total Testosterone could be due to him being Obese.  I explained to him the differences between primary and secondary hypogonadism, and possible etiologies for both. If indeed he has evidence of secondary hypogonadism, then I would recommend completing workup with evaluation of other pituitary hormones like free T4, morning cortisol levels and possibly transferrin levels and a MRI pituitary ( given his young age).  He would like to proceed with this testing. I would recommend that he come off Clomid for atleast 6-8 weeks prior to conducting this testing and he is agreeable as he is going to run out of the  script dec end.  I have also asked him to monitor his symptoms in the interim.   As far as further treatment , it would be based on repeat Testing of the Testosterone axis in a few months. I dont have much experience with Clomid and if he would like to pursue this as treatment option, would recommend that he return back to his Urologist for treatment. We did discuss topical and injectable Testosterone preparations, and their effect on suppression of sperm count. It is a variable suppression but not permanent. He  is willing to consider Testosterone preparations in the future.    RTC 2-3 months       Ian Pineda [facility] 10/25/2014 2:47 PM

## 2014-10-25 NOTE — Assessment & Plan Note (Signed)
Could be secondary to low T levels. Plan as below.

## 2014-10-25 NOTE — Assessment & Plan Note (Signed)
The patient has symptoms of low Testosterone in combination with evidence of low total Testosterone on 2 prior occasions earlier this April 2015. Free Testosterone was normal at that time. Even though SHBG has been normal, it is also likely that low Total Testosterone could be due to him being Obese.  I explained to him the differences between primary and secondary hypogonadism, and possible etiologies for both. If indeed he has evidence of secondary hypogonadism, then I would recommend completing workup with evaluation of other pituitary hormones like free T4, morning cortisol levels and possibly transferrin levels and a MRI pituitary ( given his young age).  He would like to proceed with this testing. I would recommend that he come off Clomid for atleast 6-8 weeks prior to conducting this testing and he is agreeable as he is going to run out of the script dec end.  I have also asked him to monitor his symptoms in the interim.   As far as further treatment , it would be based on repeat Testing of the Testosterone axis in a few months. I dont have much experience with Clomid and if he would like to pursue this as treatment option, would recommend that he return back to his Urologist for treatment. We did discuss topical and injectable Testosterone preparations, and their effect on suppression of sperm count. It is a variable suppression but not permanent. He is willing to consider Testosterone preparations in the future.    RTC 2-3 months

## 2014-10-29 ENCOUNTER — Encounter: Payer: Self-pay | Admitting: Internal Medicine

## 2014-10-29 DIAGNOSIS — R079 Chest pain, unspecified: Secondary | ICD-10-CM | POA: Insufficient documentation

## 2014-10-29 DIAGNOSIS — M549 Dorsalgia, unspecified: Secondary | ICD-10-CM | POA: Insufficient documentation

## 2014-10-29 DIAGNOSIS — F439 Reaction to severe stress, unspecified: Secondary | ICD-10-CM | POA: Insufficient documentation

## 2014-12-26 ENCOUNTER — Ambulatory Visit: Payer: BC Managed Care – PPO | Admitting: Endocrinology

## 2015-01-09 ENCOUNTER — Ambulatory Visit (INDEPENDENT_AMBULATORY_CARE_PROVIDER_SITE_OTHER): Payer: BLUE CROSS/BLUE SHIELD | Admitting: Endocrinology

## 2015-01-09 ENCOUNTER — Encounter: Payer: Self-pay | Admitting: Endocrinology

## 2015-01-09 ENCOUNTER — Telehealth: Payer: Self-pay

## 2015-01-09 VITALS — BP 116/78 | HR 64 | Resp 12 | Ht 72.0 in | Wt 261.5 lb

## 2015-01-09 DIAGNOSIS — R7989 Other specified abnormal findings of blood chemistry: Secondary | ICD-10-CM

## 2015-01-09 DIAGNOSIS — E291 Testicular hypofunction: Secondary | ICD-10-CM

## 2015-01-09 DIAGNOSIS — N528 Other male erectile dysfunction: Secondary | ICD-10-CM

## 2015-01-09 MED ORDER — ETODOLAC 400 MG PO TABS: ORAL_TABLET | ORAL | Status: DC

## 2015-01-09 NOTE — Telephone Encounter (Signed)
Call Rx into pharmacy at 4:50pm. E-prescibe failed.

## 2015-01-09 NOTE — Progress Notes (Signed)
Pre visit review using our clinic review tool, if applicable. No additional management support is needed unless otherwise documented below in the visit note. 

## 2015-01-09 NOTE — Telephone Encounter (Signed)
Patient in office today to see Dr. Welford RochePhadke. Patient is requesting a refill on his Lodine Rx. Patient last seen by Dr. Lorin PicketScott 10/24/14 and Rx last filled 02/27/14. Please advise if ok to fill.

## 2015-01-09 NOTE — Progress Notes (Signed)
HPI: Ian Pineda is a 29 y.o.-year-old man, here for follow up evaluation and management of low testosterone.  The patient had been symptomatic with intermittent problems with ED for the past 2-3 years, and most recently more persistent symptoms since 2015. 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 Given his interest in starting a family in the near future, he elected to try Clomiphene, which was started around April 2015 at 1 tablet every other day. His dose was adjusted back down to 0.5 tablet every other day roughly since July 2015. The patient was at his PCP appointment recently and had several questions about his hypogonadism etiology and treatment going forward. His clomid script has ben very costly as well. He has had symptom improvement with the clomid, however feels better on the higher dose of Clomid.  Per my instruction, he has been off the Clomid since Jan 8th,2016.  Has had return of symptoms of fatigue, decr libido, lack of morning erections, ED on about 1/3 sexual encounters. Denies hot flashes, change in shaving frequency or muscle weakness Denies exogenous steroids or supplements Having some nipple sensitivity, denies breast discharge  He admits for decreased libido at times. Has difficulty obtaining or maintaining an erection No trauma to testes, testicular irradiation or surgery No h/o of mumps orchitis/h/o autoimmune ds. No h/o cryptorchidism He grew and went through puberty like his peers No shrinking of testes. No very small testes (<5 ml) No incomplete/delayed sexual development     No breast discomfort/gynecomastia    No loss of body hair (axillary/pubic)/decreased need for shaving No height loss No abnormal sense of smell  No hot flushes No vision problems No worst HA of his life No FH of hypogonadism/infertility ( though family history is limited as he is adopted) Has not fathered any  children so far- interested in starting family in the next 1-3 years No FH of hemochromatosis or pituitary tumors No excessive weight gain or loss. Lost some weight through lifestyle No chronic diseases No chronic pain. Not on opiates, does not take steroids.  Occasional alcohol use No anabolic steroids use No herbal medicines Not on antidepressants  No prior hx CAD or blood clots I have reviewed his recent labs. Lab Results  Component Value Date   TESTOSTERONE 294.49* 01/15/2014   Testosterone level slightly low at 327 [phone]( [phone]) -02/2014 Normal SHBG, estradiol, free direct Testosterone 02/2014 LH normal 4.8, PRL normal 9.9 -02/2014  Lab Results  Component Value Date   TSH 1.07 01/15/2014      PE: BP 116/78 mmHg  Pulse 64  Resp 12  Ht 6' (1.829 m)  Wt 261 lb 8 oz (118.616 kg)  BMI 35.46 kg/m2  SpO2 96% Wt Readings from Last 3 Encounters:  01/09/15 261 lb 8 oz (118.616 kg)  10/24/14 255 lb 4 oz (115.781 kg)  10/24/14 254 lb 12 oz (115.554 kg)   HEENT: [place]/AT, EOMI, no icterus, no proptosis, no chemosis, no mild lid lag, no retraction, eyes close completely, gross normal VF testing on confrontation Neck: thyroid gland - smooth, non-tender, no erythema, no tracheal deviation; negative Pemberton's sign; no lymphadenopathy; no bruits Lungs: good air entry, clear bilaterally Heart: S1&S2 normal, regular rate & rhythm; no murmurs, rubs or gallops Abd: soft, NT, ND, no HSM, +BS Prior Breast: mild gynaecomastia, no discharge Genital: deferred ( Urology notes reviewed for this) Ext: no tremor in hands bilaterally, no edema, 2+  DP/PT pulses, good muscle mass Neuro: normal gait, 2+ reflexes bilaterally, normal 5/5 strength, no proximal myopathy  Derm: no pretibial myxoedema/skin dryness   ASSESSMENT: 1. Low testosterone 2. ED  PLAN:  Problem List Items Addressed This Visit      Genitourinary   Erectile dysfunction    Could be secondary to low T levels. Plan as below.            Other   Low testosterone - Primary    The patient has symptoms of low Testosterone in combination with evidence of low total Testosterone on 2 prior occasions earlier this April 2015. Free Testosterone was normal at that time. Even though SHBG has been normal, it is also likely that low Total Testosterone could be due to him being Obese.  I explained to him the differences between primary and secondary hypogonadism, and possible etiologies for both. If indeed he has evidence of secondary hypogonadism, then I would recommend completing workup with evaluation of other pituitary hormones like free T4, morning cortisol levels and possibly transferrin levels and a MRI pituitary ( given his young age).  He would like to proceed with this testing. Now that he has come off Clomid for atleast 6-8 weeks, will conduct this testing and he is agreeable. Return next week morning for testing.     As far as further treatment , it would be based on repeat Testing of the Testosterone axis. I dont have much experience with Clomid and if he would like to pursue this as treatment option, would recommend that he return back to his Urologist for treatment. We did discuss topical and injectable Testosterone preparations, and their effect on suppression of sperm count at prior visit. Considering starting a family at end of this year.  He is willing to consider Testosterone preparations in the future.    RTC 2 months         Relevant Orders   TSH   T4, free   Comprehensive metabolic panel   Testosterone,Free and Total   Prolactin   FSH/LH   T3, free   Transferrin     Arn Mcomber PUSHKAR 01/10/2015 3:42 PM

## 2015-01-09 NOTE — Patient Instructions (Signed)
Continue to stay off the clomid.  Return for fasting labs around march 4th ( Friday ) between 8-9 am . Following workup results- will determined next course of treatment.  Please come back for a follow-up appointment in 2 months.

## 2015-01-09 NOTE — Telephone Encounter (Signed)
#  [phone] approved by Dr. Lorin PicketScott.

## 2015-01-10 NOTE — Assessment & Plan Note (Signed)
The patient has symptoms of low Testosterone in combination with evidence of low total Testosterone on 2 prior occasions earlier this April 2015. Free Testosterone was normal at that time. Even though SHBG has been normal, it is also likely that low Total Testosterone could be due to him being Obese.  I explained to him the differences between primary and secondary hypogonadism, and possible etiologies for both. If indeed he has evidence of secondary hypogonadism, then I would recommend completing workup with evaluation of other pituitary hormones like free T4, morning cortisol levels and possibly transferrin levels and a MRI pituitary ( given his young age).  He would like to proceed with this testing. Now that he has come off Clomid for atleast 6-8 weeks, will conduct this testing and he is agreeable. Return next week morning for testing.     As far as further treatment , it would be based on repeat Testing of the Testosterone axis. I dont have much experience with Clomid and if he would like to pursue this as treatment option, would recommend that he return back to his Urologist for treatment. We did discuss topical and injectable Testosterone preparations, and their effect on suppression of sperm count at prior visit. Considering starting a family at end of this year.  He is willing to consider Testosterone preparations in the future.    RTC 2 months

## 2015-01-10 NOTE — Assessment & Plan Note (Signed)
Could be secondary to low T levels. Plan as below.  

## 2015-01-17 ENCOUNTER — Other Ambulatory Visit (INDEPENDENT_AMBULATORY_CARE_PROVIDER_SITE_OTHER): Payer: BLUE CROSS/BLUE SHIELD

## 2015-01-17 DIAGNOSIS — E291 Testicular hypofunction: Secondary | ICD-10-CM

## 2015-01-17 DIAGNOSIS — R7989 Other specified abnormal findings of blood chemistry: Secondary | ICD-10-CM

## 2015-01-17 LAB — COMPREHENSIVE METABOLIC PANEL
ALT: 23 U/L (ref 0–53)
AST: 17 U/L (ref 0–37)
Albumin: 4.6 g/dL (ref 3.5–5.2)
Alkaline Phosphatase: 57 U/L (ref 39–117)
BILIRUBIN TOTAL: 0.9 mg/dL (ref 0.2–1.2)
BUN: 12 mg/dL (ref 6–23)
CHLORIDE: 105 meq/L (ref 96–112)
CO2: 26 mEq/L (ref 19–32)
CREATININE: 0.89 mg/dL (ref 0.40–1.50)
Calcium: 9.4 mg/dL (ref 8.4–10.5)
GFR: 109.21 mL/min (ref >60.00)
Glucose, Bld: 97 mg/dL (ref 70–99)
Potassium: 4.3 mEq/L (ref 3.5–5.1)
Sodium: 138 mEq/L (ref 135–145)
Total Protein: 7 g/dL (ref 6.0–8.3)

## 2015-01-17 LAB — TSH: TSH: 1 u[IU]/mL (ref 0.35–4.50)

## 2015-01-17 LAB — T3, FREE: T3 FREE: 4 pg/mL (ref 2.3–4.2)

## 2015-01-17 LAB — TRANSFERRIN: Transferrin: 256 mg/dL (ref 212.0–360.0)

## 2015-01-17 LAB — T4, FREE: Free T4: 0.84 ng/dL (ref 0.60–1.60)

## 2015-01-18 LAB — TESTOSTERONE,FREE AND TOTAL
TESTOSTERONE FREE: 6 pg/mL — AB (ref 9.3–26.5)
Testosterone: 248 ng/dL — ABNORMAL LOW (ref 348–1197)

## 2015-01-18 LAB — PROLACTIN: PROLACTIN: 5.7 ng/mL (ref 2.1–17.1)

## 2015-01-18 LAB — FSH/LH
FSH: 2.4 m[IU]/mL (ref 1.4–18.1)
LH: 4.2 m[IU]/mL (ref 1.5–9.3)

## 2015-01-20 ENCOUNTER — Other Ambulatory Visit: Payer: Self-pay | Admitting: Endocrinology

## 2015-01-20 DIAGNOSIS — R7989 Other specified abnormal findings of blood chemistry: Secondary | ICD-10-CM

## 2015-01-21 ENCOUNTER — Other Ambulatory Visit: Payer: Self-pay | Admitting: Internal Medicine

## 2015-01-31 ENCOUNTER — Encounter: Payer: Self-pay | Admitting: Internal Medicine

## 2015-01-31 ENCOUNTER — Other Ambulatory Visit (INDEPENDENT_AMBULATORY_CARE_PROVIDER_SITE_OTHER): Payer: BLUE CROSS/BLUE SHIELD

## 2015-01-31 DIAGNOSIS — R7989 Other specified abnormal findings of blood chemistry: Secondary | ICD-10-CM

## 2015-01-31 DIAGNOSIS — E291 Testicular hypofunction: Secondary | ICD-10-CM

## 2015-01-31 LAB — CORTISOL: CORTISOL PLASMA: 9.6 ug/dL

## 2015-02-03 ENCOUNTER — Other Ambulatory Visit: Payer: Self-pay | Admitting: Endocrinology

## 2015-02-03 DIAGNOSIS — R7989 Other specified abnormal findings of blood chemistry: Secondary | ICD-10-CM

## 2015-02-03 LAB — TESTOSTERONE,FREE AND TOTAL
Testosterone, Free: 7.1 pg/mL — ABNORMAL LOW (ref 9.3–26.5)
Testosterone: 290 ng/dL — ABNORMAL LOW (ref 348–1197)

## 2015-02-03 LAB — ACTH: C206 ACTH: 24 pg/mL (ref 6–50)

## 2015-02-11 ENCOUNTER — Telehealth: Payer: Self-pay

## 2015-02-11 NOTE — Telephone Encounter (Signed)
Patient called stating he received my message. Patient stated that he is agreeable to the MRI of the pitutary he's only request is that he gets the latest possible appointment due to work schedule. Patient stated he has to talk to his boss about his schedule before he can schedule his lab appointment. Patient stated he will call tomorrow to schedule lab appointment for cortisol test.

## 2015-02-12 NOTE — Telephone Encounter (Signed)
I cant find Myriam JacobsonHelen on our system. Please could you let her know of this scheduling request/  thanks

## 2015-02-12 NOTE — Telephone Encounter (Signed)
Please note patient's scheduling request below for his MRI. Thank you

## 2015-02-19 ENCOUNTER — Ambulatory Visit (INDEPENDENT_AMBULATORY_CARE_PROVIDER_SITE_OTHER): Payer: BLUE CROSS/BLUE SHIELD | Admitting: *Deleted

## 2015-02-19 ENCOUNTER — Other Ambulatory Visit (INDEPENDENT_AMBULATORY_CARE_PROVIDER_SITE_OTHER): Payer: BLUE CROSS/BLUE SHIELD

## 2015-02-19 ENCOUNTER — Encounter: Payer: Self-pay | Admitting: Internal Medicine

## 2015-02-19 DIAGNOSIS — E291 Testicular hypofunction: Secondary | ICD-10-CM

## 2015-02-19 DIAGNOSIS — E2740 Unspecified adrenocortical insufficiency: Secondary | ICD-10-CM

## 2015-02-19 DIAGNOSIS — R7989 Other specified abnormal findings of blood chemistry: Secondary | ICD-10-CM

## 2015-02-19 LAB — CORTISOL
CORTISOL PLASMA: 18.3 ug/dL
Cortisol, Plasma: 9.8 ug/dL
Cortisol, Plasma: 20.8 ug/dL

## 2015-02-19 MED ORDER — COSYNTROPIN 0.25 MG IJ SOLR
0.2500 mg | Freq: Once | INTRAMUSCULAR | Status: AC
  Administered 2015-02-19: 0.25 mg via INTRAMUSCULAR

## 2015-02-19 NOTE — Addendum Note (Signed)
Addended by: Montine CircleMALDONADO, Samantha Olivera D on: 02/19/2015 08:56 AM   Modules accepted: Orders

## 2015-02-19 NOTE — Addendum Note (Signed)
Addended by: Montine CircleMALDONADO, Klea Nall D on: 02/19/2015 09:19 AM   Modules accepted: Orders

## 2015-02-20 ENCOUNTER — Other Ambulatory Visit: Payer: BLUE CROSS/BLUE SHIELD

## 2015-02-24 ENCOUNTER — Ambulatory Visit (INDEPENDENT_AMBULATORY_CARE_PROVIDER_SITE_OTHER): Payer: BLUE CROSS/BLUE SHIELD | Admitting: Internal Medicine

## 2015-02-24 ENCOUNTER — Encounter: Payer: Self-pay | Admitting: Internal Medicine

## 2015-02-24 VITALS — BP 108/69 | HR 60 | Temp 98.3°F | Ht 72.0 in | Wt 257.5 lb

## 2015-02-24 DIAGNOSIS — N528 Other male erectile dysfunction: Secondary | ICD-10-CM | POA: Diagnosis not present

## 2015-02-24 DIAGNOSIS — M545 Low back pain: Secondary | ICD-10-CM

## 2015-02-24 DIAGNOSIS — E291 Testicular hypofunction: Secondary | ICD-10-CM | POA: Diagnosis not present

## 2015-02-24 DIAGNOSIS — R7989 Other specified abnormal findings of blood chemistry: Secondary | ICD-10-CM

## 2015-02-24 DIAGNOSIS — R079 Chest pain, unspecified: Secondary | ICD-10-CM | POA: Diagnosis not present

## 2015-02-24 DIAGNOSIS — Z658 Other specified problems related to psychosocial circumstances: Secondary | ICD-10-CM

## 2015-02-24 DIAGNOSIS — F439 Reaction to severe stress, unspecified: Secondary | ICD-10-CM

## 2015-02-24 NOTE — Progress Notes (Signed)
Pre visit review using our clinic review tool, if applicable. No additional management support is needed unless otherwise documented below in the visit note. 

## 2015-02-25 ENCOUNTER — Encounter: Payer: Self-pay | Admitting: Internal Medicine

## 2015-02-25 NOTE — Assessment & Plan Note (Signed)
Being worked up by Dr Welford RochePhadke.

## 2015-02-25 NOTE — Assessment & Plan Note (Signed)
Had EKG last visit.  SR with no acute ischemic changes.  Still with some intermittent chest pain as outlined.  Discussed f/u EKG.  Since performed last visit, wanted to hold on f/u EKG this visit.  Unsure of family history.  Pt is adopted.  Given persistent pain, will schedule stress echo to confirm no active ischemia.  Decrease antiinflammatories.  If stress test ok and symptoms persist despite above, may need zantac.   Follow.

## 2015-02-25 NOTE — Assessment & Plan Note (Signed)
Currently being worked up by Dr Welford RochePhadke.  Planning for MRI soon.

## 2015-02-25 NOTE — Assessment & Plan Note (Signed)
Increased stress as outlined.  Follow.   

## 2015-02-25 NOTE — Progress Notes (Signed)
Patient ID: Ian Pineda, male   DOB: 08/12/1986, 29 y.o.   MRN: [id number]   Subjective:    Patient ID: Ian Pineda, male    DOB: [birth year], 30 y.o.   MRN: [id number]  HPI  Patient here for a scheduled follow up.  Has been seeing Dr Welford Roche for w/up for erectile dysfunction.  See her note for details.  Planning for MRI soon.  Had questions regarding this.  Discussed.  Is exercising now.  Trying to watch what he eats.  Bowels stable.  No urinary symptoms.  He does report still noticing some intermittent chest pain.  Was questioning if related to stress.  Reports increased stress.  States pain can occur at work and also when active.  Unsure of family history.  Pt is adopted.  Previously had EKG - ok.    Past Medical History  Diagnosis Date  . Syncope     one episode worked up (cardiology and endocrinology).  felt to be related to hypoglycemia    Current Outpatient Prescriptions on File Prior to Visit  Medication Sig Dispense Refill  . etodolac (LODINE) 400 MG tablet TAKE 1 TABLET BY MOUTH TWICE A DAY AS NEEDED 30 tablet 0   No current facility-administered medications on file prior to visit.    Review of Systems  Constitutional: Negative for appetite change and unexpected weight change.  HENT: Negative for congestion and sinus pressure.   Respiratory: Negative for cough, chest tightness and shortness of breath.   Cardiovascular: Positive for chest pain. Negative for palpitations and leg swelling.  Gastrointestinal: Negative for nausea, vomiting, abdominal pain and diarrhea.  Genitourinary: Negative for dysuria and difficulty urinating.  Musculoskeletal: Negative for back pain (no pain now.  flares intermittently. ) and joint swelling.  Neurological: Negative for dizziness, light-headedness and headaches.  Psychiatric/Behavioral: Negative for dysphoric mood and agitation.       Objective:     Blood pressure recheck:  128/78  Physical Exam  Constitutional: He appears  well-developed and well-nourished. No distress.  HENT:  Nose: Nose normal.  Mouth/Throat: Oropharynx is clear and moist.  Neck: Neck supple.  Cardiovascular: Normal rate and regular rhythm.   Pulmonary/Chest: Effort normal and breath sounds normal. No respiratory distress.  Abdominal: Soft. Bowel sounds are normal. There is no tenderness.  Musculoskeletal: He exhibits no edema or tenderness.  Lymphadenopathy:    He has no cervical adenopathy.  Skin: No rash noted. No erythema.  Psychiatric: He has a normal mood and affect. His behavior is normal.    BP 108/69 mmHg  Pulse 60  Temp(Src) 98.3 F (36.8 C) (Oral)  Ht 6' (1.829 m)  Wt 257 lb 8 oz (116.801 kg)  BMI 34.92 kg/m2  SpO2 98% Wt Readings from Last 3 Encounters:  02/24/15 257 lb 8 oz (116.801 kg)  01/09/15 261 lb 8 oz (118.616 kg)  10/24/14 255 lb 4 oz (115.781 kg)     Lab Results  Component Value Date   WBC 8.5 02/07/2014   HGB 14.3 02/07/2014   HCT 42.4 02/07/2014   PLT 237.0 02/07/2014   GLUCOSE 97 01/17/2015   ALT 23 01/17/2015   AST 17 01/17/2015   NA 138 01/17/2015   K 4.3 01/17/2015   CL 105 01/17/2015   CREATININE 0.89 01/17/2015   BUN 12 01/17/2015   CO2 26 01/17/2015   TSH 1.00 01/17/2015       Assessment & Plan:   Problem List Items Addressed This Visit    Back pain  Flares intermittently.  Takes lodine prn.  Follow.  Does not take on a regular basis.  Instructed to use tylenol prn.        Chest pain - Primary    Had EKG last visit.  SR with no acute ischemic changes.  Still with some intermittent chest pain as outlined.  Discussed f/u EKG.  Since performed last visit, wanted to hold on f/u EKG this visit.  Unsure of family history.  Pt is adopted.  Given persistent pain, will schedule stress echo to confirm no active ischemia.  Decrease antiinflammatories.  If stress test ok and symptoms persist despite above, may need zantac.   Follow.        Relevant Orders   Echocardiogram stress test  without contrast   Erectile dysfunction    Currently being worked up by Dr Welford RochePhadke.  Planning for MRI soon.        Low testosterone    Being worked up by Dr Welford RochePhadke.        Stress    Increased stress as outlined.  Follow.          I spent 25 minutes with the patient and more than 50% of the time was spent in consultation regarding the above.     Dale DurhamSCOTT, Claudett Bayly, MD

## 2015-02-25 NOTE — Assessment & Plan Note (Signed)
Flares intermittently.  Takes lodine prn.  Follow.  Does not take on a regular basis.  Instructed to use tylenol prn.

## 2015-03-07 ENCOUNTER — Ambulatory Visit
Admit: 2015-03-07 | Disposition: A | Discharge status: Home / Self Care | Payer: Self-pay | Attending: Endocrinology | Admitting: Endocrinology

## 2015-03-11 ENCOUNTER — Telehealth: Payer: Self-pay

## 2015-03-11 NOTE — Telephone Encounter (Signed)
Spoke to patient to notify him of Dr. Ephriam JenkinsPhadke's comments. Patient verbalized understanding. Patient stated that he is running out of time off at work and doesn't know when he will be able to come in for fasting lab. Patient would like to discuss this with Dr. Welford RochePhadke tomorrow.

## 2015-03-11 NOTE — Telephone Encounter (Signed)
-----   Message from Quentin Cornwalladhika P Phadke, MD sent at 03/11/2015 11:02 AM EDT ----- Regarding: call pt with results-  I have reviewed MRI pituitary-  Done [facility]  On 03/07/15-  Normal scan without any lesions in the pituitary. Negative MRI of the brain.   He likely has functional secondary hypogonadism. I would like to check his levels once again for baseline in the morning, fasting , 8-9 am prior to starting treatment- which we will discuss tomorrow.   thanks

## 2015-03-12 ENCOUNTER — Telehealth: Payer: Self-pay | Admitting: *Deleted

## 2015-03-12 ENCOUNTER — Encounter: Payer: Self-pay | Admitting: Endocrinology

## 2015-03-12 ENCOUNTER — Ambulatory Visit (INDEPENDENT_AMBULATORY_CARE_PROVIDER_SITE_OTHER): Payer: BLUE CROSS/BLUE SHIELD | Admitting: Endocrinology

## 2015-03-12 VITALS — BP 124/60 | HR 55 | Temp 98.8°F | Resp 16 | Ht 72.0 in | Wt 254.8 lb

## 2015-03-12 DIAGNOSIS — E291 Testicular hypofunction: Secondary | ICD-10-CM | POA: Diagnosis not present

## 2015-03-12 DIAGNOSIS — R7989 Other specified abnormal findings of blood chemistry: Secondary | ICD-10-CM

## 2015-03-12 NOTE — Patient Instructions (Signed)
Labs locally to get baseline Testosterone levels before starting Testosterone therapy.   Please come back for a follow-up appointment in 1 month.

## 2015-03-12 NOTE — Progress Notes (Signed)
Patient ID: Ian RidgelBrandon Lamar Fogarty, male   DOB: [birth year], 27 y.o.   MRN: [id number]    HPI: Ian Pineda is a 29 y.o.-year-old man, here for follow up evaluation and management of low testosterone.  The patient had been symptomatic with intermittent problems with ED for the past 2-3 years, and most recently more persistent symptoms since 2015. He was sent to [facility], and was noted to have low T level, which was worked up as secondary hypogonadism. Given his interest in starting a family in the near future, he elected to try Clomiphene, which was started around April 2015 at 1 tablet every other day. His dose was adjusted back down to 0.5 tablet every other day roughly since July 2015. The patient was at his PCP appointment recently and had several questions about his hypogonadism etiology and treatment going forward. His clomid script has ben very costly as well. He has had symptom improvement with the clomid, however feels better on the higher dose of Clomid. Reports that his levels for Testosterone were fluctuating some on the Clomid.  Per my instruction, he has been off the Clomid since Jan 8th,2016.    Has had return of symptoms of fatigue, decr libido, lack of morning erections, ED on about 1/3 sexual encounters. Denies hot flashes, change in shaving frequency or muscle weakness Denies exogenous steroids or supplements Having some nipple sensitivity, denies breast discharge Wife and he are thinking of having a family next year, after some travel. She is also on birth control at this time and the plan is for them to come off meds together and then try for conception.  Recently lost some weight through better lifestyle choices   Recent workup shows normal- FSH, LH, pituitary hormones [facility]07[facility]15 does not show any pituitary lesions. Stalk midline.   No prior hx CAD or blood clots I have reviewed his recent labs. Lab Results  Component Value Date    TESTOSTERONE 290* 01/31/2015   TESTOSTERONE 248* 01/17/2015   TESTOSTERONE 294.49* 01/15/2014   Testosterone level slightly low at 327 [phone]( [phone]) -02/2014 Normal SHBG, estradiol, free direct Testosterone 02/2014 LH normal 4.8, PRL normal 9.9 -02/2014  Lab Results  Component Value Date   TSH 1.00 01/17/2015   TSH 1.07 01/15/2014      PE: BP 124/60 mmHg  Pulse 55  Temp(Src) 98.8 F (37.1 C) (Oral)  Resp 16  Ht 6' (1.829 m)  Wt 254 lb 12.8 oz (115.577 kg)  BMI 34.55 kg/m2  SpO2 98% Wt Readings from Last 3 Encounters:  03/12/15 254 lb 12.8 oz (115.577 kg)  02/24/15 257 lb 8 oz (116.801 kg)  01/09/15 261 lb 8 oz (118.616 kg)   Exam: deferred  ASSESSMENT: 1. Low testosterone   PLAN:  Problem List Items Addressed This Visit      Other   Low testosterone - Primary    The patient has symptoms of low Testosterone in combination with evidence of low total Testosterone on 2 prior occasions earlier this April 2015. Free Testosterone was normal at that time. Repeat levels after coming off the Clomid show that he has secondary functional hypogonadism. Recent MRI pituitary normal.  We discussed about his diagnosis, and discussed role of weight loss- which could improve his Testosterone levels.  We dicussed treatment differences between Clomid and Testosterone. We discussed about possibility of reduction in sperm count and infertility with exogenous Testosterone. He understands that this decrease in sperm count could be reversible  but may take a long time to recover as well. The reduction in sperm count on exogenous Testosterone may not be complete either. He understands that Clomid might be the better choice when it comes to rx in case fertility is desired. Alternatively pulsatile GNRH therapy could also be tried, in case of infertility.   He wishes to try Testosterone replacement - discussed various options and he has elected to try Androgel after understanding risk and benefits.   Will have him test CBC,PSA and total and free Testosterone again in the morning for baseline and give him script for Androgel after this.                  RTC 1 month 25 minutes spent with the patient, >50% time spent on discussing his diagnosis, workup results and treatment options, infertility and effects of various preparations.  Patient reports a small soft tissue swelling on his hand, for which he has sent a message to Dr Lorin Picket for further opinion  [facility] 03/13/2015 12:01 PM

## 2015-03-12 NOTE — Telephone Encounter (Signed)
During pts triage for Dr Ephriam JenkinsPhadke's appoint today he mentioned his has a knot on his right hand between his second and third fingers.  Pt further states he is unable to bear weight on that hand as in doing push ups.  Please advise

## 2015-03-12 NOTE — Telephone Encounter (Signed)
Pt scheduled with [facility] on 5.4.16.

## 2015-03-12 NOTE — Telephone Encounter (Signed)
If pain and inability to bear weight - we can evaluate to determine etiology.  If wants to be seen, can be worked in.  I am not here tomorrow or Monday.  If needs to see Lyla SonCarrie - ok.

## 2015-03-13 NOTE — Telephone Encounter (Signed)
Discussed with patient at visit yday. He will have labs done locally in [place] close to his workplace

## 2015-03-13 NOTE — Assessment & Plan Note (Signed)
The patient has symptoms of low Testosterone in combination with evidence of low total Testosterone on 2 prior occasions earlier this April 2015. Free Testosterone was normal at that time. Repeat levels after coming off the Clomid show that he has secondary functional hypogonadism. Recent MRI pituitary normal.  We discussed about his diagnosis, and discussed role of weight loss- which could improve his Testosterone levels.  We dicussed treatment differences between Clomid and Testosterone. We discussed about possibility of reduction in sperm count and infertility with exogenous Testosterone. He understands that this decrease in sperm count could be reversible but may take a long time to recover as well. The reduction in sperm count on exogenous Testosterone may not be complete either. He understands that Clomid might be the better choice when it comes to rx in case fertility is desired. Alternatively pulsatile GNRH therapy could also be tried, in case of infertility.   He wishes to try Testosterone replacement - discussed various options and he has elected to try Androgel after understanding risk and benefits.  Will have him test CBC,PSA and total and free Testosterone again in the morning for baseline and give him script for Androgel after this.

## 2015-03-17 ENCOUNTER — Ambulatory Visit (INDEPENDENT_AMBULATORY_CARE_PROVIDER_SITE_OTHER): Payer: BLUE CROSS/BLUE SHIELD | Admitting: Nurse Practitioner

## 2015-03-17 ENCOUNTER — Encounter: Payer: Self-pay | Admitting: Nurse Practitioner

## 2015-03-17 VITALS — BP 112/70 | HR 61 | Temp 98.1°F | Ht 72.0 in | Wt 259.0 lb

## 2015-03-17 DIAGNOSIS — M67441 Ganglion, right hand: Secondary | ICD-10-CM | POA: Insufficient documentation

## 2015-03-17 NOTE — Progress Notes (Signed)
   Subjective:    Patient ID: Ian Pineda, male    DOB: 1986/09/01, 29 y.o.   MRN: [id number]  HPI  Mr. Ferdinand CavaMise is a 29 yo male with a CC of knot on right hand x 2-3 months.   1) Dorsal surface of right hand. Growing and slightly more painful.  Started a new work out program, could not put weight on right hand as much, looked and noticed the knot. Knot is not painful.  No treatment to date. Flexion of wrist is worst, neutral is best. Posterior wrist is where the pain is located and is mild. Etodolac may be helpful.   Review of Systems  Constitutional: Negative for fever, chills, diaphoresis and fatigue.  Musculoskeletal: Positive for myalgias.  Skin: Negative for rash.  Neurological: Negative for dizziness, weakness and numbness.  Psychiatric/Behavioral: The patient is not nervous/anxious.       Objective:   Physical Exam  Constitutional: He is oriented to person, place, and time. He appears well-developed and well-nourished. No distress.  BP 112/70 mmHg  Pulse 61  Temp(Src) 98.1 F (36.7 C) (Oral)  Ht 6' (1.829 m)  Wt 259 lb (117.482 kg)  BMI 35.12 kg/m2  SpO2 97%   HENT:  Head: Normocephalic and atraumatic.  Right Ear: External ear normal.  Left Ear: External ear normal.  Musculoskeletal: Normal range of motion. He exhibits no edema or tenderness.  7 mm lump on dorsal proximal hand.   Neurological: He is alert and oriented to person, place, and time.  Skin: Skin is warm and dry. No rash noted. He is not diaphoretic.  Psychiatric: He has a normal mood and affect. His behavior is normal. Judgment and thought content normal.      Assessment & Plan:

## 2015-03-17 NOTE — Progress Notes (Signed)
Pre visit review using our clinic review tool, if applicable. No additional management support is needed unless otherwise documented below in the visit note. 

## 2015-03-17 NOTE — Assessment & Plan Note (Signed)
Right posterior hand worsening ganglion cyst. Approx 7 mm and hard to touch. Discussed options for evaluation and treatment. Pt will stick with US (hopefully scheduled so he doesn't miss work) and Etodolac. Does not wish to see ortho or sports medicine for aspiration at this time. FU prn worsening/failure to improve.

## 2015-03-17 NOTE — Patient Instructions (Signed)
We will contact you to set up your ultrasound.  Ganglion Cyst A ganglion cyst is a noncancerous, fluid-filled lump that occurs near joints or tendons. The ganglion cyst grows out of a joint or the lining of a tendon. It most often develops in the hand or wrist but can also develop in the shoulder, elbow, hip, knee, ankle, or foot. The round or oval ganglion can be pea sized or larger than a grape. Increased activity may enlarge the size of the cyst because more fluid starts to build up.  CAUSES  It is not completely known what causes a ganglion cyst to grow. However, it may be related to:  Inflammation or irritation around the joint.  An injury.  Repetitive movements or overuse.  Arthritis. SYMPTOMS  A lump most often appears in the hand or wrist, but can occur in other areas of the body. Generally, the lump is painless without other symptoms. However, sometimes pain can be felt during activity or when pressure is applied to the lump. The lump may even be tender to the touch. Tingling, pain, numbness, or muscle weakness can occur if the ganglion cyst presses on a nerve. Your grip may be weak and you may have less movement in your joints.  DIAGNOSIS  Ganglion cysts are most often diagnosed based on a physical exam, noting where the cyst is and how it looks. Your caregiver will feel the lump and may shine a light alongside it. If it is a ganglion, a light often shines through it. Your caregiver may order an X-ray, ultrasound, or MRI to rule out other conditions. TREATMENT  Ganglions usually go away on their own without treatment. If pain or other symptoms are involved, treatment may be needed. Treatment is also needed if the ganglion limits your movement or if it gets infected. Treatment options include:  Wearing a wrist or finger brace or splint.  Taking anti-inflammatory medicine.  Draining fluid from the lump with a needle (aspiration).  Injecting a steroid into the joint.  Surgery to  remove the ganglion cyst and its stalk that is attached to the joint or tendon. However, ganglion cysts can grow back. HOME CARE INSTRUCTIONS   Do not press on the ganglion, poke it with a needle, or hit it with a heavy object. You may rub the lump gently and often. Sometimes fluid moves out of the cyst.  Only take medicines as directed by your caregiver.  Wear your brace or splint as directed by your caregiver. SEEK MEDICAL CARE IF:   Your ganglion becomes larger or more painful.  You have increased redness, red streaks, or swelling.  You have pus coming from the lump.  You have weakness or numbness in the affected area. MAKE SURE YOU:   Understand these instructions.  Will watch your condition.  Will get help right away if you are not doing well or get worse. Document Released: 10/29/2000 Document Revised: 07/26/2012 Document Reviewed: 12/26/2007 [facility] Patient Information 2015 [employer] [place]. This information is not intended to replace advice given to you by your health care provider. Make sure you discuss any questions you have with your health care provider.

## 2015-03-18 ENCOUNTER — Other Ambulatory Visit: Payer: Self-pay | Admitting: Internal Medicine

## 2015-03-18 DIAGNOSIS — R079 Chest pain, unspecified: Secondary | ICD-10-CM

## 2015-03-18 DIAGNOSIS — R0789 Other chest pain: Principal | ICD-10-CM

## 2015-03-31 ENCOUNTER — Ambulatory Visit
Admission: RE | Admit: 2015-03-31 | Discharge: 2015-03-31 | Disposition: A | Discharge status: Home / Self Care | Payer: BLUE CROSS/BLUE SHIELD | Source: Ambulatory Visit | Attending: Nurse Practitioner | Admitting: Nurse Practitioner

## 2015-03-31 ENCOUNTER — Ambulatory Visit (INDEPENDENT_AMBULATORY_CARE_PROVIDER_SITE_OTHER): Payer: BLUE CROSS/BLUE SHIELD

## 2015-03-31 DIAGNOSIS — M67441 Ganglion, right hand: Secondary | ICD-10-CM | POA: Insufficient documentation

## 2015-03-31 DIAGNOSIS — R0789 Other chest pain: Secondary | ICD-10-CM

## 2015-03-31 DIAGNOSIS — R079 Chest pain, unspecified: Secondary | ICD-10-CM

## 2015-04-01 ENCOUNTER — Ambulatory Visit: Payer: BLUE CROSS/BLUE SHIELD

## 2015-04-01 LAB — ECHOCARDIOGRAM STRESS TEST
CHL CUP STRESS STAGE 1 DBP: 80 mmHg
CHL CUP STRESS STAGE 1 GRADE: 0 %
CHL CUP STRESS STAGE 1 HR: 109 {beats}/min
CHL CUP STRESS STAGE 3 GRADE: 10 %
CHL CUP STRESS STAGE 3 SPEED: 1.7 mph
CHL CUP STRESS STAGE 4 HR: 126 {beats}/min
CHL CUP STRESS STAGE 5 DBP: 72 mmHg
CHL CUP STRESS STAGE 6 SPEED: 4.1 mph
CHL CUP STRESS STAGE 7 SPEED: 0 mph
CHL CUP STRESS STAGE 8 HR: 112 {beats}/min
CHL CUP STRESS STAGE 8 SPEED: 0 mph
CSEPPMHR: 96 %
Estimated workload: 13.1 METS
Peak HR: 187 {beats}/min
Stage 1 SBP: 140 mmHg
Stage 1 Speed: 0 mph
Stage 2 Grade: 0.1 %
Stage 2 HR: 110 {beats}/min
Stage 2 Speed: 0 mph
Stage 3 HR: 115 {beats}/min
Stage 4 DBP: 81 mmHg
Stage 4 Grade: 12 %
Stage 4 SBP: 231 mmHg
Stage 4 Speed: 2.5 mph
Stage 5 Grade: 14 %
Stage 5 HR: 166 {beats}/min
Stage 5 SBP: 228 mmHg
Stage 5 Speed: 3.4 mph
Stage 6 Grade: 16 %
Stage 6 HR: 187 {beats}/min
Stage 7 Grade: 0 %
Stage 7 HR: 153 {beats}/min
Stage 8 DBP: 65 mmHg
Stage 8 Grade: 0 %
Stage 8 SBP: 134 mmHg

## 2015-04-02 ENCOUNTER — Telehealth: Payer: Self-pay

## 2015-04-02 ENCOUNTER — Telehealth: Payer: Self-pay | Admitting: *Deleted

## 2015-04-02 NOTE — Telephone Encounter (Signed)
Patient call requesting results.  The only results I found where on a US of the upper extremity on 5/16?  Have you reviewed his US?  Please advise.  Kenney Housemananya RN

## 2015-04-02 NOTE — Telephone Encounter (Signed)
Ive already spoken to this patient. This is Dr. Roby LoftsScott's patient

## 2015-04-02 NOTE — Telephone Encounter (Signed)
Pt wanted to know how often he will need to come back for labs. He will call back in a few days to get his lab appt scheduled but wants to know if this is going to be a repetitive thing. Please advise.

## 2015-04-02 NOTE — Progress Notes (Signed)
Reviewed/discussed US results with patient.  Verbalized understanding.

## 2015-04-03 NOTE — Telephone Encounter (Signed)
He needs to come in for baseline testing first, following which I will give him the script. This Testosterone therapy needs to be monitored initially monthly till we get his levels right, then could be done about once every 6 months after that.

## 2015-04-04 NOTE — Telephone Encounter (Signed)
Spoke to patient to notify him of Dr. Ephriam JenkinsPhadke's comments. Patient verbalized understanding. Patient stated that he is going to [facility] to have baseline levels drawn next week. He is agreeable to monthly lab draws as long as it can continue to be done at [facility]. It is close to his job and he does not have to take off work to go there. Patient also requested a callback from Dr. Lorin PicketScott. Patient stated that its a personal matter related to the treatment plan for his low testosterone levels. Patient understands that Dr. Welford RochePhadke is handling this matter and he just want to let Dr. Lorin PicketScott know about his concerns related to treatment. He would like to speak directly to Dr. Lorin PicketScott whenever she has a chance. He is not in a rush for a returned call. Patient was very pleasant over the phone.

## 2015-04-08 ENCOUNTER — Encounter: Payer: Self-pay | Admitting: *Deleted

## 2015-04-08 NOTE — Telephone Encounter (Signed)
Noted, thanks!

## 2015-04-10 ENCOUNTER — Ambulatory Visit: Payer: BLUE CROSS/BLUE SHIELD | Admitting: Endocrinology

## 2015-04-11 NOTE — Telephone Encounter (Signed)
Will call pt when return to work.

## 2015-05-08 ENCOUNTER — Telehealth: Payer: Self-pay | Admitting: *Deleted

## 2015-05-08 NOTE — Telephone Encounter (Signed)
Pt left voicemail asking for Dr. Lorin Picket to return his call. I called patient back to get the reason for his call & he said it was personal and he has only spoken to Dr. Lorin Picket about this. He prefers to only speak with Dr. Lorin Picket. Pt aware that Dr. Lorin Picket will not be back in the office until Monday & states that it is no urgency and it can wait until then.

## 2015-05-11 NOTE — Telephone Encounter (Signed)
Will call when return to office.

## 2015-05-16 NOTE — Telephone Encounter (Signed)
Has this patient been addressed?

## 2015-05-30 MED ORDER — ETODOLAC 400 MG PO TABS: 400.0000 mg | ORAL_TABLET | Freq: Two times a day (BID) | ORAL | Status: DC | PRN

## 2015-05-30 NOTE — Telephone Encounter (Signed)
I had left him messages.  Tried him again today.  Spoke with pt.  He wanted a refill on lodine.  rx sent int.  Had questions about MRI bill but states he got this straightened out.  He had questions about the f/u of his hand.  We will reevaluate this at his next appt.  Also had questions about the f/u regarding DR Phadke.  Discussed may need referral to another endocrinologist.  Will get earlier f/u.

## 2015-05-30 NOTE — Telephone Encounter (Signed)
Dr.Scott, have you spoke with patient recently? He called again about a week or two ago per [place]. Please advise.

## 2015-06-26 ENCOUNTER — Encounter: Payer: Self-pay | Admitting: Internal Medicine

## 2015-06-26 ENCOUNTER — Ambulatory Visit (INDEPENDENT_AMBULATORY_CARE_PROVIDER_SITE_OTHER): Payer: BLUE CROSS/BLUE SHIELD | Admitting: Internal Medicine

## 2015-06-26 VITALS — BP 112/64 | HR 81 | Temp 98.2°F | Ht 72.0 in | Wt 261.6 lb

## 2015-06-26 DIAGNOSIS — E291 Testicular hypofunction: Secondary | ICD-10-CM | POA: Diagnosis not present

## 2015-06-26 DIAGNOSIS — N528 Other male erectile dysfunction: Secondary | ICD-10-CM

## 2015-06-26 DIAGNOSIS — F439 Reaction to severe stress, unspecified: Secondary | ICD-10-CM

## 2015-06-26 DIAGNOSIS — Z658 Other specified problems related to psychosocial circumstances: Secondary | ICD-10-CM

## 2015-06-26 DIAGNOSIS — M545 Low back pain: Secondary | ICD-10-CM | POA: Diagnosis not present

## 2015-06-26 DIAGNOSIS — R7989 Other specified abnormal findings of blood chemistry: Secondary | ICD-10-CM

## 2015-06-26 DIAGNOSIS — M67441 Ganglion, right hand: Secondary | ICD-10-CM

## 2015-06-26 MED ORDER — NYSTATIN 100000 UNIT/GM EX CREA
1.0000 | TOPICAL_CREAM | Freq: Two times a day (BID) | CUTANEOUS | Status: DC
Start: 2015-06-26 — End: 2015-09-29

## 2015-06-26 NOTE — Progress Notes (Signed)
Pre visit review using our clinic review tool, if applicable. No additional management support is needed unless otherwise documented below in the visit note. 

## 2015-06-26 NOTE — Progress Notes (Signed)
Patient ID: Ian Pineda, male   DOB: [birth year], 29 y.o.   MRN: [id number]    Subjective:    Patient ID: Ian Pineda, male    DOB: [birth year], 29 y.o.   MRN: [id number]  HPI  Patient here for a scheduled follow up.  Has been having persistent problems with erectile dysfunction.  Had been seeing Dr Welford Roche.  See her notes for details.  Has had extensive w/up.  It was apparently felt he had secondary functional hypogonadism.  They had discussed restarting clomid and the possibility of starting testosterone replacement.  He is frustrated.  Still unsure of the "why".  Also frustrated because is having more problems with obtaining and sustaining erections.  Works long hours.  Some fatigue.  No cardiac symptoms with increased activity or exertion.  No sob.  Bowels stable.  Palpable protuberance - hand.  Non tender.    Past Medical History  Diagnosis Date  . Syncope     one episode worked up (cardiology and endocrinology).  felt to be related to hypoglycemia    Family history and social history reviewed.     Outpatient Encounter Prescriptions as of 06/26/2015  Medication Sig  . etodolac (LODINE) 400 MG tablet Take 1 tablet (400 mg total) by mouth 2 (two) times daily as needed.  . nystatin cream (MYCOSTATIN) Apply 1 application topically 2 (two) times daily.   No facility-administered encounter medications on file as of 06/26/2015.    Review of Systems  Constitutional: Positive for fatigue. Negative for appetite change and unexpected weight change.       Trying to watch his diet.  Has lost some weight.   HENT: Negative for congestion and sinus pressure.   Respiratory: Negative for cough, chest tightness and shortness of breath.   Cardiovascular: Negative for chest pain, palpitations and leg swelling.  Gastrointestinal: Negative for nausea, vomiting, abdominal pain and diarrhea.  Genitourinary: Negative for dysuria and difficulty urinating.       Having problems with sustaining and  obtaining erections.    Musculoskeletal:       Takes lodine prn for his back.  No significant problems with his back.    Skin: Negative for color change.       Small red lesion - penis.  Non tender.  No vesicles.    Neurological: Negative for dizziness, light-headedness and headaches.  Hematological: Negative for adenopathy. Does not bruise/bleed easily.  Psychiatric/Behavioral: Negative for dysphoric mood and agitation.       Objective:    Physical Exam  Constitutional: He appears well-developed and well-nourished. No distress.  HENT:  Nose: Nose normal.  Mouth/Throat: Oropharynx is clear and moist.  Eyes: Conjunctivae are normal. Right eye exhibits no discharge. Left eye exhibits discharge.  Neck: Neck supple. No thyromegaly present.  Cardiovascular: Normal rate and regular rhythm.   Pulmonary/Chest: Effort normal and breath sounds normal. No respiratory distress.  Abdominal: Soft. Bowel sounds are normal. There is no tenderness.  Musculoskeletal: He exhibits no edema or tenderness.  Lymphadenopathy:    He has no cervical adenopathy.  Skin: No rash noted.  Small red lesion - penis.  Non tender.  No vesicles.   Psychiatric: He has a normal mood and affect. His behavior is normal.    BP 112/64 mmHg  Pulse 81  Temp(Src) 98.2 F (36.8 C) (Oral)  Ht 6' (1.829 m)  Wt 261 lb 9.6 oz (118.661 kg)  BMI 35.47 kg/m2  SpO2 97% Wt Readings from Last 3 Encounters:  06/26/15 261 lb 9.6 oz (118.661 kg)  03/17/15 259 lb (117.482 kg)  03/12/15 254 lb 12.8 oz (115.577 kg)     Lab Results  Component Value Date   WBC 8.5 02/07/2014   HGB 14.3 02/07/2014   HCT 42.4 02/07/2014   PLT 237.0 02/07/2014   GLUCOSE 97 01/17/2015   ALT 23 01/17/2015   AST 17 01/17/2015   NA 138 01/17/2015   K 4.3 01/17/2015   CL 105 01/17/2015   CREATININE 0.89 01/17/2015   BUN 12 01/17/2015   CO2 26 01/17/2015   TSH 1.00 01/17/2015    [place] Extrem Up Right Ltd  04/01/2015   CLINICAL DATA:  Dorsal  hand 7 mm painful knot.  EXAM: ULTRASOUND RIGHT UPPER EXTREMITY LIMITED  TECHNIQUE: Ultrasound examination of the upper extremity soft tissues was performed in the area of clinical concern.  COMPARISON:  No prior.  FINDINGS: No cystic or solid abnormalities identified. No mass lesion noted. If symptoms persist MRI can be obtained.  IMPRESSION: No focal abnormality identified.   Electronically Signed   By: Maisie Fus  Register   On: 04/01/2015 08:27       Assessment & Plan:   Problem List Items Addressed This Visit    Back pain    Stable.  Takes lodine prn.  Follow.        Erectile dysfunction - Primary    Persistent issues as outlined.  Work up per Dr Welford Roche.  Discussed at length with him today.  Discussed his frustration.  Will refer to another endocrinologist.  Follow.  Has seen Dr Lonna Cobb previously.        Ganglion of right hand    Previously felt to have ganglion cyst - hand.  Exam - feels like bony protuberance.  Had ultrasound - negative.  Non tender.  Discussed ortho referral.  Wants to monitor at this time.  Follow.       Low testosterone    See Dr Ephriam Jenkins notes.  Pt with increased frustration.  Refer to endocrinology.        Stress    Mostly related to the issues with his low testosterone and evaluation.  He feels no further intervention is warranted.  Follow.            Ian [place], MD

## 2015-06-28 ENCOUNTER — Encounter: Payer: Self-pay | Admitting: Internal Medicine

## 2015-06-28 NOTE — Assessment & Plan Note (Signed)
Mostly related to the issues with his low testosterone and evaluation.  He feels no further intervention is warranted.  Follow.

## 2015-06-28 NOTE — Assessment & Plan Note (Signed)
Previously felt to have ganglion cyst - hand.  Exam - feels like bony protuberance.  Had ultrasound - negative.  Non tender.  Discussed ortho referral.  Wants to monitor at this time.  Follow.

## 2015-06-28 NOTE — Assessment & Plan Note (Signed)
Stable.  Takes lodine prn.  Follow.

## 2015-06-28 NOTE — Assessment & Plan Note (Signed)
See Dr Ephriam Jenkins notes.  Pt with increased frustration.  Refer to endocrinology.

## 2015-06-28 NOTE — Assessment & Plan Note (Signed)
Persistent issues as outlined.  Work up per Dr Welford Roche.  Discussed at length with him today.  Discussed his frustration.  Will refer to another endocrinologist.  Follow.  Has seen Dr Lonna Cobb previously.

## 2015-07-10 ENCOUNTER — Telehealth: Payer: Self-pay | Admitting: *Deleted

## 2015-07-10 NOTE — Telephone Encounter (Signed)
Efraim Kaufmann was working to find out something about MRI.  I think she left him a message about the MRI.  Can confirm with Melissa.  I did speak to the endocrinologist.  They agree with need for further w/up - and feel needs evaluation at either [facility] or [facility].  Let me know if agreeable and I will place the order for the referral.

## 2015-07-10 NOTE — Telephone Encounter (Signed)
Patient left voicemail stating that he hasnt heard back from you. He states that at his last visit you were going to make some calls and get back with him. Please advise.

## 2015-07-11 NOTE — Telephone Encounter (Signed)
Spoke with pt, he states he will call back on Monday with decision on where he wants to go [facility] or [place]

## 2015-07-11 NOTE — Telephone Encounter (Signed)
Ok.  Hold for call back.

## 2015-07-17 ENCOUNTER — Encounter: Payer: Self-pay | Admitting: *Deleted

## 2015-07-17 ENCOUNTER — Other Ambulatory Visit: Payer: Self-pay | Admitting: Internal Medicine

## 2015-07-17 DIAGNOSIS — R7989 Other specified abnormal findings of blood chemistry: Secondary | ICD-10-CM

## 2015-07-17 NOTE — Telephone Encounter (Signed)
Pt never called back. Mailed letter

## 2015-07-17 NOTE — Progress Notes (Signed)
Order placed for endocrinology referral.  

## 2015-07-17 NOTE — Telephone Encounter (Signed)
Pt called back today & states that he would like to go to [facility]. He also requested to speak with Dr. Lorin Picket about a personal matter & he had a few personal questions to ask.

## 2015-07-17 NOTE — Telephone Encounter (Signed)
Called pt and discussed his concerns and issues.  Clarified referral to [facility] endocrinology.  He had questions about FMLA.  Order placed for referral.

## 2015-07-18 NOTE — Telephone Encounter (Signed)
Closing message until pt returns call.  Attempted to call today no answer and no VM

## 2015-07-23 ENCOUNTER — Telehealth: Payer: Self-pay | Admitting: Internal Medicine

## 2015-07-23 NOTE — Telephone Encounter (Signed)
Pt dropped off FMLA paperwork today 09/07 in Dr Lorin Picket box. Thank You!

## 2015-07-24 NOTE — Telephone Encounter (Signed)
Form completed.  Placed in your box.   

## 2015-07-24 NOTE — Telephone Encounter (Signed)
Form faxed & copy mailed to patient 

## 2015-07-28 ENCOUNTER — Telehealth: Payer: Self-pay | Admitting: *Deleted

## 2015-07-28 NOTE — Telephone Encounter (Signed)
Patient had requested to have FMLA forms faxed over to his job. -thanks

## 2015-07-28 NOTE — Telephone Encounter (Signed)
Spoke with pt, advised forms faxed to his job and mailed to him already

## 2015-08-19 ENCOUNTER — Telehealth: Payer: Self-pay

## 2015-08-19 NOTE — Telephone Encounter (Signed)
[facility] called, Ian Pineda needs clinical notes from Dr. Lorin Picket for a referral that was placed.  Fax to [phone]

## 2015-09-29 ENCOUNTER — Ambulatory Visit (INDEPENDENT_AMBULATORY_CARE_PROVIDER_SITE_OTHER): Payer: BLUE CROSS/BLUE SHIELD | Admitting: Internal Medicine

## 2015-09-29 ENCOUNTER — Encounter: Payer: Self-pay | Admitting: Internal Medicine

## 2015-09-29 VITALS — BP 118/76 | HR 69 | Temp 98.4°F | Resp 18 | Ht 72.0 in | Wt 271.2 lb

## 2015-09-29 DIAGNOSIS — Z658 Other specified problems related to psychosocial circumstances: Secondary | ICD-10-CM | POA: Diagnosis not present

## 2015-09-29 DIAGNOSIS — E291 Testicular hypofunction: Secondary | ICD-10-CM | POA: Diagnosis not present

## 2015-09-29 DIAGNOSIS — N528 Other male erectile dysfunction: Secondary | ICD-10-CM | POA: Diagnosis not present

## 2015-09-29 DIAGNOSIS — M25522 Pain in left elbow: Secondary | ICD-10-CM

## 2015-09-29 DIAGNOSIS — Z0000 Encounter for general adult medical examination without abnormal findings: Secondary | ICD-10-CM

## 2015-09-29 DIAGNOSIS — F439 Reaction to severe stress, unspecified: Secondary | ICD-10-CM

## 2015-09-29 DIAGNOSIS — M545 Low back pain: Secondary | ICD-10-CM

## 2015-09-29 DIAGNOSIS — R7989 Other specified abnormal findings of blood chemistry: Secondary | ICD-10-CM

## 2015-09-29 MED ORDER — ETODOLAC 400 MG PO TABS: 400.0000 mg | ORAL_TABLET | Freq: Two times a day (BID) | ORAL | Status: DC | PRN

## 2015-09-29 NOTE — Progress Notes (Signed)
Patient ID: Wille Aubuchon, male   DOB: June 26, 1986, 29 y.o.   MRN: [id number]   Subjective:    Patient ID: Arafat Cocuzza, male    DOB: 10-27-1986, 29 y.o.   MRN: [id number]  HPI  Patient presents for a physical exam.  He reports he is doing relatively well.  Did recently see endocrinology for erectile dysfunction.  W/up in progress.  Tries to stay active.  No cardiac symptoms with increased activity or exertion.  No sob.  Works a lot of hours.  Handling stress.  No acid reflux.   No abdominal pain or cramping.  Bowels stable.  He does report increased left elbow and shoulder pain.  Has been present for four weeks.  No known injury, but does use his arms a lot at work.  Does a lot of repetitive motion and with increased weight.  Has not tried any medication for his elbow or shoulder.     Past Medical History  Diagnosis Date  . Syncope     one episode worked up (cardiology and endocrinology).  felt to be related to hypoglycemia   Past Surgical History  Procedure Laterality Date  . Ankle surgery  2003  . Wisdom tooth extraction     Family History  Problem Relation Age of Onset  . Adopted: Yes   Social History   Social History  . Marital Status: Married    Spouse Name: N/A  . Number of Children: N/A  . Years of Education: N/A   Social History Main Topics  . Smoking status: Former Games developer  . Smokeless tobacco: Never Used  . Alcohol Use: No  . Drug Use: No  . Sexual Activity: Not Asked   Other Topics Concern  . None   Social History Narrative    Outpatient Encounter Prescriptions as of 09/29/2015  Medication Sig  . etodolac (LODINE) 400 MG tablet Take 1 tablet (400 mg total) by mouth 2 (two) times daily as needed.  . [DISCONTINUED] etodolac (LODINE) 400 MG tablet Take 1 tablet (400 mg total) by mouth 2 (two) times daily as needed.  . [DISCONTINUED] nystatin cream (MYCOSTATIN) Apply 1 application topically 2 (two) times daily.   No facility-administered encounter  medications on file as of 09/29/2015.    Review of Systems  Constitutional: Negative for appetite change and unexpected weight change.       Has decided to exercise.  Adjust diet.    HENT: Negative for congestion and sinus pressure.   Eyes: Negative for pain and visual disturbance.  Respiratory: Negative for cough, chest tightness and shortness of breath.   Cardiovascular: Negative for chest pain, palpitations and leg swelling.  Gastrointestinal: Negative for nausea, vomiting, abdominal pain and diarrhea.  Genitourinary: Negative for dysuria and difficulty urinating.  Musculoskeletal: Negative for back pain and joint swelling.       Left shoulder pain and left elbow pain as outlined.    Skin: Negative for color change and rash.  Neurological: Negative for dizziness, light-headedness and headaches.  Hematological: Negative for adenopathy. Does not bruise/bleed easily.  Psychiatric/Behavioral: Negative for dysphoric mood and agitation.       Objective:    Physical Exam  Constitutional: He is oriented to person, place, and time. He appears well-developed and well-nourished. No distress.  HENT:  Head: Normocephalic and atraumatic.  Nose: Nose normal.  Mouth/Throat: Oropharynx is clear and moist. No oropharyngeal exudate.  Eyes: Conjunctivae are normal. Right eye exhibits no discharge. Left eye exhibits no discharge.  Neck:  Neck supple. No thyromegaly present.  Cardiovascular: Normal rate and regular rhythm.   Pulmonary/Chest: Breath sounds normal. No respiratory distress. He has no wheezes.  Abdominal: Soft. Bowel sounds are normal. There is no tenderness.  Genitourinary:  Performed through endocrinology.   Musculoskeletal: He exhibits no edema or tenderness.  Lymphadenopathy:    He has no cervical adenopathy.  Neurological: He is alert and oriented to person, place, and time.  Skin: Skin is warm and dry. No rash noted. No erythema.  Psychiatric: He has a normal mood and affect.  His behavior is normal.    BP 118/76 mmHg  Pulse 69  Temp(Src) 98.4 F (36.9 C) (Oral)  Resp 18  Ht 6' (1.829 m)  Wt 271 lb 4 oz (123.038 kg)  BMI 36.78 kg/m2  SpO2 97% Wt Readings from Last 3 Encounters:  09/29/15 271 lb 4 oz (123.038 kg)  06/26/15 261 lb 9.6 oz (118.661 kg)  03/17/15 259 lb (117.482 kg)     Lab Results  Component Value Date   WBC 8.5 02/07/2014   HGB 14.3 02/07/2014   HCT 42.4 02/07/2014   PLT 237.0 02/07/2014   GLUCOSE 97 01/17/2015   ALT 23 01/17/2015   AST 17 01/17/2015   NA 138 01/17/2015   K 4.3 01/17/2015   CL 105 01/17/2015   CREATININE 0.89 01/17/2015   BUN 12 01/17/2015   CO2 26 01/17/2015   TSH 1.00 01/17/2015        Assessment & Plan:   Problem List Items Addressed This Visit    Back pain    Stable.  Follow.        Relevant Medications   etodolac (LODINE) 400 MG tablet   Erectile dysfunction - Primary    Seeing endocrinology.  W/up in progress.        Health care maintenance    Physical today 09/29/15.  Prostate check performed by endocrinology.        Left elbow pain    Left elbow pain and left shoulder pain.  Pain to palpation.  Increased pain with rotation of his forearm.  Trial of elbow strap.  lodine as directed.  Wants to hold on further w/up.  Will update me over the next 1-2 weeks.        Low testosterone    Seeing endocrinology.  W/up in progress.        Stress    Feels he is handling stress well.  Follow.  Does not feel any further intervention warranted at this time.           Dale DurhamSCOTT, Taccara Bushnell, MD

## 2015-09-29 NOTE — Progress Notes (Signed)
Pre visit review using our clinic review tool, if applicable. No additional management support is needed unless otherwise documented below in the visit note. 

## 2015-10-05 ENCOUNTER — Encounter: Payer: Self-pay | Admitting: Internal Medicine

## 2015-10-05 DIAGNOSIS — M25522 Pain in left elbow: Secondary | ICD-10-CM | POA: Insufficient documentation

## 2015-10-05 DIAGNOSIS — Z0000 Encounter for general adult medical examination without abnormal findings: Secondary | ICD-10-CM | POA: Insufficient documentation

## 2015-10-05 NOTE — Assessment & Plan Note (Signed)
Seeing endocrinology.  W/up in progress.   

## 2015-10-05 NOTE — Assessment & Plan Note (Signed)
Seeing endocrinology.  W/up in progress.

## 2015-10-05 NOTE — Assessment & Plan Note (Signed)
Feels he is handling stress well.  Follow.  Does not feel any further intervention warranted at this time.

## 2015-10-05 NOTE — Assessment & Plan Note (Signed)
Stable.  Follow.   

## 2015-10-05 NOTE — Assessment & Plan Note (Signed)
Physical today 09/29/15.  Prostate check performed by endocrinology.

## 2015-10-05 NOTE — Assessment & Plan Note (Signed)
Left elbow pain and left shoulder pain.  Pain to palpation.  Increased pain with rotation of his forearm.  Trial of elbow strap.  lodine as directed.  Wants to hold on further w/up.  Will update me over the next 1-2 weeks.

## 2015-12-22 ENCOUNTER — Ambulatory Visit
Admission: EM | Admit: 2015-12-22 | Discharge: 2015-12-22 | Disposition: A | Discharge status: Home / Self Care | Payer: BLUE CROSS/BLUE SHIELD | Attending: Family Medicine | Admitting: Family Medicine

## 2015-12-22 DIAGNOSIS — J069 Acute upper respiratory infection, unspecified: Secondary | ICD-10-CM

## 2015-12-22 HISTORY — DX: Dorsalgia, unspecified: M54.9

## 2015-12-22 LAB — RAPID INFLUENZA A&B ANTIGENS (ARMC ONLY)
INFLUENZA A (ARMC): NOT DETECTED
INFLUENZA B (ARMC): NOT DETECTED

## 2015-12-22 MED ORDER — GUAIFENESIN-CODEINE 100-10 MG/5ML PO SOLN: ORAL | Status: DC

## 2015-12-22 NOTE — ED Notes (Signed)
Saturday noted runny nose and cough. Yesterday + nasal congestion and fever last night(temp not taken). + productive cough

## 2015-12-22 NOTE — ED Provider Notes (Signed)
CSN: [id number]     Arrival date & time 12/22/15  1400 History   First MD Initiated Contact with Patient 12/22/15 1526     Chief Complaint  Patient presents with  . URI   (Consider location/radiation/quality/duration/timing/severity/associated sxs/prior Treatment) Patient is a 30 y.o. male presenting with URI. The history is provided by the patient.  URI Presenting symptoms: congestion, cough and rhinorrhea   Severity:  Moderate Onset quality:  Sudden Duration:  3 days Timing:  Constant Progression:  Worsening Chronicity:  New Relieved by:  None tried Associated symptoms: no arthralgias, no myalgias, no neck pain, no sinus pain, no sneezing, no swollen glands and no wheezing   Risk factors: not elderly, no chronic cardiac disease, no chronic kidney disease, no chronic respiratory disease, no diabetes mellitus, no immunosuppression, no recent illness and no recent travel     Past Medical History  Diagnosis Date  . Syncope     one episode worked up (cardiology and endocrinology).  felt to be related to hypoglycemia  . Back pain    Past Surgical History  Procedure Laterality Date  . Ankle surgery  2003  . Wisdom tooth extraction    . Leg surgery     Family History  Problem Relation Age of Onset  . Adopted: Yes   Social History  Substance Use Topics  . Smoking status: Current Some Day Smoker    Types: Cigarettes  . Smokeless tobacco: Never Used  . Alcohol Use: 0.0 oz/week    0 Standard drinks or equivalent per week     Comment: socially    Review of Systems  HENT: Positive for congestion and rhinorrhea. Negative for sneezing.   Respiratory: Positive for cough. Negative for wheezing.   Musculoskeletal: Negative for myalgias, arthralgias and neck pain.    Allergies  Review of patient's allergies indicates no known allergies.  Home Medications   Prior to Admission medications   Medication Sig Start Date End Date Taking? Authorizing Provider  etodolac (LODINE) 400  MG tablet Take 1 tablet (400 mg total) by mouth 2 (two) times daily as needed. 09/29/15  Yes Dale Mount Carmel, MD  sildenafil (VIAGRA) 50 MG tablet Take 50 mg by mouth daily as needed for erectile dysfunction.   Yes Historical Provider, MD  guaiFENesin-codeine 100-10 MG/5ML syrup 10 ml po qhs prn 12/22/15   Payton Mccallum, MD   Meds Ordered and Administered this Visit  Medications - No data to display  BP 131/80 mmHg  Pulse 96  Temp(Src) 99.1 F (37.3 C) (Tympanic)  Resp 17  Ht 6' (1.829 m)  Wt 279 lb (126.554 kg)  BMI 37.83 kg/m2  SpO2 99% No data found.   Physical Exam  Constitutional: He appears well-developed and well-nourished. No distress.  HENT:  Head: Normocephalic and atraumatic.  Right Ear: Tympanic membrane, external ear and ear canal normal.  Left Ear: Tympanic membrane, external ear and ear canal normal.  Nose: Nose normal.  Mouth/Throat: Uvula is midline and mucous membranes are normal. Posterior oropharyngeal erythema present. No oropharyngeal exudate, posterior oropharyngeal edema or tonsillar abscesses.  Eyes: Conjunctivae and EOM are normal. Pupils are equal, round, and reactive to light. Right eye exhibits no discharge. Left eye exhibits no discharge. No scleral icterus.  Neck: Normal range of motion. Neck supple. No tracheal deviation present. No thyromegaly present.  Cardiovascular: Normal rate, regular rhythm and normal heart sounds.   Pulmonary/Chest: Effort normal and breath sounds normal. No stridor. No respiratory distress. He has no wheezes. He has  no rales. He exhibits no tenderness.  Lymphadenopathy:    He has no cervical adenopathy.  Neurological: He is alert.  Skin: Skin is warm and dry. No rash noted. He is not diaphoretic.  Nursing note and vitals reviewed.   ED Course  Procedures (including critical care time)  Labs Review Labs Reviewed  RAPID INFLUENZA A&B ANTIGENS [facility] ONLY)    Imaging Review No results found.   Visual Acuity  Review  Right Eye Distance:   Left Eye Distance:   Bilateral Distance:    Right Eye Near:   Left Eye Near:    Bilateral Near:         MDM   1. Viral URI    Discharge Medication List as of 12/22/2015  3:44 PM    START taking these medications   Details  guaiFENesin-codeine 100-10 MG/5ML syrup 10 ml po qhs prn, Print       1. diagnosis reviewed with patient 2. rx as per orders above; reviewed possible side effects, interactions, risks and benefits  3. Recommend supportive treatment with increased fluids, otc analgesics prn 4. Follow-up prn if symptoms worsen or don't improve  Payton Mccallum, MD 12/22/15 1600

## 2016-01-07 ENCOUNTER — Telehealth: Payer: Self-pay | Admitting: *Deleted

## 2016-01-07 NOTE — Telephone Encounter (Signed)
Patient was advise by [facility] to have his Testerone levels  tested in the office. Patient has paper orders, Please advise if it would be appropriate to schedule patient . Pt contact [phone]

## 2016-01-07 NOTE — Telephone Encounter (Signed)
He can bring to orders to the lab with tests and diagnosis if he desires.  He had informed me previously that it was more convenient for him to go to [place].  If he has orders, then he can take the orders to [facility] ([facility] and have labs dawn there.  May be more convenient for him.

## 2016-01-07 NOTE — Telephone Encounter (Signed)
Please advise 

## 2016-01-08 NOTE — Telephone Encounter (Signed)
Pt scheduled for abs on 01/13/16

## 2016-01-12 ENCOUNTER — Telehealth: Payer: Self-pay | Admitting: *Deleted

## 2016-01-12 DIAGNOSIS — Z13220 Encounter for screening for lipoid disorders: Secondary | ICD-10-CM

## 2016-01-12 DIAGNOSIS — R7989 Other specified abnormal findings of blood chemistry: Secondary | ICD-10-CM

## 2016-01-12 DIAGNOSIS — F439 Reaction to severe stress, unspecified: Secondary | ICD-10-CM

## 2016-01-12 NOTE — Telephone Encounter (Signed)
Pt coming in tomorrow what labs and dx?  

## 2016-01-13 ENCOUNTER — Telehealth: Payer: Self-pay | Admitting: *Deleted

## 2016-01-13 ENCOUNTER — Other Ambulatory Visit (INDEPENDENT_AMBULATORY_CARE_PROVIDER_SITE_OTHER): Payer: BLUE CROSS/BLUE SHIELD

## 2016-01-13 DIAGNOSIS — R7989 Other specified abnormal findings of blood chemistry: Secondary | ICD-10-CM

## 2016-01-13 DIAGNOSIS — E291 Testicular hypofunction: Secondary | ICD-10-CM | POA: Diagnosis not present

## 2016-01-13 DIAGNOSIS — R6889 Other general symptoms and signs: Secondary | ICD-10-CM | POA: Diagnosis not present

## 2016-01-13 DIAGNOSIS — Z13220 Encounter for screening for lipoid disorders: Secondary | ICD-10-CM

## 2016-01-13 LAB — COMPREHENSIVE METABOLIC PANEL
ALK PHOS: 57 U/L (ref 39–117)
ALT: 23 U/L (ref 0–53)
AST: 16 U/L (ref 0–37)
Albumin: 4.4 g/dL (ref 3.5–5.2)
BILIRUBIN TOTAL: 0.7 mg/dL (ref 0.2–1.2)
BUN: 12 mg/dL (ref 6–23)
CO2: 27 meq/L (ref 19–32)
Calcium: 9.4 mg/dL (ref 8.4–10.5)
Chloride: 104 mEq/L (ref 96–112)
Creatinine, Ser: 0.86 mg/dL (ref 0.40–1.50)
GFR: 112.79 mL/min (ref >60.00)
GLUCOSE: 114 mg/dL — AB (ref 70–99)
Potassium: 4.2 mEq/L (ref 3.5–5.1)
SODIUM: 138 meq/L (ref 135–145)
TOTAL PROTEIN: 6.7 g/dL (ref 6.0–8.3)

## 2016-01-13 LAB — LIPID PANEL
CHOL/HDL RATIO: 3
Cholesterol: 122 mg/dL (ref 0–200)
HDL: 43.9 mg/dL (ref >39.00)
LDL Cholesterol: 60 mg/dL (ref 0–99)
NONHDL: 78.19
Triglycerides: 92 mg/dL (ref 0.0–149.0)
VLDL: 18.4 mg/dL (ref 0.0–40.0)

## 2016-01-13 LAB — LUTEINIZING HORMONE: LH: 5.42 m[IU]/mL (ref 1.50–9.30)

## 2016-01-13 LAB — TSH: TSH: 0.82 u[IU]/mL (ref 0.35–4.50)

## 2016-01-13 LAB — CBC WITH DIFFERENTIAL/PLATELET
Basophils Absolute: 0 10*3/uL (ref 0.0–0.1)
Basophils Relative: 0.5 % (ref 0.0–3.0)
EOS PCT: 2.6 % (ref 0.0–5.0)
Eosinophils Absolute: 0.2 10*3/uL (ref 0.0–0.7)
HCT: 46.3 % (ref 39.0–52.0)
Hemoglobin: 15.8 g/dL (ref 13.0–17.0)
LYMPHS PCT: 22.8 % (ref 12.0–46.0)
Lymphs Abs: 1.8 10*3/uL (ref 0.7–4.0)
MCHC: 34 g/dL (ref 30.0–36.0)
MCV: 85.4 fl (ref 78.0–100.0)
Monocytes Absolute: 0.4 10*3/uL (ref 0.1–1.0)
Monocytes Relative: 5.1 % (ref 3.0–12.0)
NEUTROS PCT: 69 % (ref 43.0–77.0)
Neutro Abs: 5.5 10*3/uL (ref 1.4–7.7)
PLATELETS: 259 10*3/uL (ref 150.0–400.0)
RBC: 5.42 Mil/uL (ref 4.22–5.81)
RDW: 12.8 % (ref 11.5–15.5)
WBC: 8 10*3/uL (ref 4.0–10.5)

## 2016-01-13 LAB — FOLLICLE STIMULATING HORMONE: FSH: 4.1 m[IU]/mL (ref 1.4–18.1)

## 2016-01-13 LAB — CORTISOL: Cortisol, Plasma: 10 ug/dL

## 2016-01-13 NOTE — Telephone Encounter (Signed)
ok 

## 2016-01-13 NOTE — Telephone Encounter (Signed)
I have placed the order for my labs.  He is also going to bring in a lab slip for labs requested by outside labs.

## 2016-01-13 NOTE — Telephone Encounter (Signed)
Testosterone, free and total dx e29.1  LH dx e29.1  FSH dx e29.1  Cortisol dx r68.89  ACTH dx r68.89  Ok  For me to put them in?  He would also like results faxed over to  [facility] Faxed [phone]  And phone # [phone]

## 2016-01-14 ENCOUNTER — Encounter: Payer: Self-pay | Admitting: *Deleted

## 2016-01-14 LAB — TESTOSTERONE,FREE AND TOTAL
TESTOSTERONE: 327 ng/dL — AB (ref 348–1197)
Testosterone, Free: 11.8 pg/mL (ref 9.3–26.5)

## 2016-01-16 LAB — ACTH: C206 ACTH: 28 pg/mL (ref 6–50)

## 2016-01-26 ENCOUNTER — Telehealth: Payer: Self-pay | Admitting: Internal Medicine

## 2016-01-26 NOTE — Telephone Encounter (Signed)
Pt called about his lab results that he got done on 01/13/2016 to be faxed to [phone] to attention to Dr Bronson Curboviello. Thank you!

## 2016-01-26 NOTE — Telephone Encounter (Signed)
These labs were sent via fax already to the number provided. Thanks

## 2016-01-29 ENCOUNTER — Encounter: Payer: Self-pay | Admitting: Internal Medicine

## 2016-01-29 ENCOUNTER — Ambulatory Visit (INDEPENDENT_AMBULATORY_CARE_PROVIDER_SITE_OTHER): Payer: BLUE CROSS/BLUE SHIELD | Admitting: Internal Medicine

## 2016-01-29 VITALS — BP 118/70 | HR 79 | Temp 98.7°F | Resp 20 | Ht 72.0 in | Wt 274.0 lb

## 2016-01-29 DIAGNOSIS — F439 Reaction to severe stress, unspecified: Secondary | ICD-10-CM

## 2016-01-29 DIAGNOSIS — M67441 Ganglion, right hand: Secondary | ICD-10-CM

## 2016-01-29 DIAGNOSIS — Z658 Other specified problems related to psychosocial circumstances: Secondary | ICD-10-CM

## 2016-01-29 DIAGNOSIS — L989 Disorder of the skin and subcutaneous tissue, unspecified: Secondary | ICD-10-CM | POA: Diagnosis not present

## 2016-01-29 DIAGNOSIS — N528 Other male erectile dysfunction: Secondary | ICD-10-CM

## 2016-01-29 DIAGNOSIS — R739 Hyperglycemia, unspecified: Secondary | ICD-10-CM

## 2016-01-29 DIAGNOSIS — Z23 Encounter for immunization: Secondary | ICD-10-CM

## 2016-01-29 NOTE — Progress Notes (Signed)
Patient ID: Ian Pineda, male   DOB: Feb 04, 1986, 30 y.o.   MRN: [id number]   Subjective:    Patient ID: Ian Pineda, male    DOB: October 11, 1986, 29 y.o.   MRN: [id number]  HPI  Patient here for a scheduled follow up.  Back is stable.  No significant flares.  Has lodine if needed.  Discussed diet and exercise.  Has started back monitoring diet intake.  No chest pain or tightness.  Seeing endocrinology.  Being w/up for erectile dysfunction.  See endocrinology note.  No abdominal pain or cramping.  Bowels stable.  Right hand lesion and left foot lesion.  Persistent.  Request referral to dermatology.     Past Medical History  Diagnosis Date  . Syncope     one episode worked up (cardiology and endocrinology).  felt to be related to hypoglycemia  . Back pain    Past Surgical History  Procedure Laterality Date  . Ankle surgery  2003  . Wisdom tooth extraction    . Leg surgery     Family History  Problem Relation Age of Onset  . Adopted: Yes   Social History   Social History  . Marital Status: Married    Spouse Name: N/A  . Number of Children: N/A  . Years of Education: N/A   Social History Main Topics  . Smoking status: Current Some Day Smoker    Types: Cigarettes  . Smokeless tobacco: Never Used  . Alcohol Use: 0.0 oz/week    0 Standard drinks or equivalent per week     Comment: socially  . Drug Use: No  . Sexual Activity: Not Asked   Other Topics Concern  . None   Social History Narrative    Outpatient Encounter Prescriptions as of 01/29/2016  Medication Sig  . etodolac (LODINE) 400 MG tablet Take 1 tablet (400 mg total) by mouth 2 (two) times daily as needed.  . sildenafil (VIAGRA) 50 MG tablet Take 50 mg by mouth daily as needed for erectile dysfunction.  . [DISCONTINUED] guaiFENesin-codeine 100-10 MG/5ML syrup 10 ml po qhs prn   No facility-administered encounter medications on file as of 01/29/2016.   cm   Review of Systems  Constitutional: Negative for  appetite change.       Has adjusted his diet.    HENT: Negative for congestion and sinus pressure.   Respiratory: Negative for cough, chest tightness and shortness of breath.   Cardiovascular: Negative for chest pain, palpitations and leg swelling.  Gastrointestinal: Negative for nausea, vomiting, abdominal pain and diarrhea.  Genitourinary: Negative for dysuria and difficulty urinating.  Musculoskeletal: Negative for myalgias and joint swelling.       Back - not a significant issue.    Skin: Negative for color change and rash.  Neurological: Negative for dizziness, light-headedness and headaches.  Psychiatric/Behavioral: Negative for dysphoric mood and agitation.       Objective:    Physical Exam  Constitutional: He appears well-developed and well-nourished. No distress.  HENT:  Nose: Nose normal.  Mouth/Throat: Oropharynx is clear and moist.  Eyes: Right eye exhibits no discharge. Left eye exhibits no discharge.  Neck: Neck supple. No thyromegaly present.  Cardiovascular: Normal rate and regular rhythm.   Pulmonary/Chest: Effort normal and breath sounds normal. No respiratory distress.  Abdominal: Soft. Bowel sounds are normal. There is no tenderness.  Musculoskeletal: He exhibits no edema or tenderness.  Lymphadenopathy:    He has no cervical adenopathy.  Skin: No rash noted. No  erythema.  Psychiatric: He has a normal mood and affect. His behavior is normal.    BP 118/70 mmHg  Pulse 79  Temp(Src) 98.7 F (37.1 C) (Oral)  Resp 20  Ht 6' (1.829 m)  Wt 274 lb (124.286 kg)  BMI 37.15 kg/m2  SpO2 98% Wt Readings from Last 3 Encounters:  01/29/16 274 lb (124.286 kg)  12/22/15 279 lb (126.554 kg)  09/29/15 271 lb 4 oz (123.038 kg)     Lab Results  Component Value Date   WBC 8.0 01/13/2016   HGB 15.8 01/13/2016   HCT 46.3 01/13/2016   PLT 259.0 01/13/2016   GLUCOSE 114* 01/13/2016   CHOL 122 01/13/2016   TRIG 92.0 01/13/2016   HDL 43.90 01/13/2016   LDLCALC 60  01/13/2016   ALT 23 01/13/2016   AST 16 01/13/2016   NA 138 01/13/2016   K 4.2 01/13/2016   CL 104 01/13/2016   CREATININE 0.86 01/13/2016   BUN 12 01/13/2016   CO2 27 01/13/2016   TSH 0.82 01/13/2016   HGBA1C 5.5 01/29/2016       Assessment & Plan:   Problem List Items Addressed This Visit    Erectile dysfunction    Seeing endocrinology.  Being worked up.        Ganglion of right hand    Previously felt to have ganglion cyst - hand.  Exam - feels like a bony protuberance.  Had ultrasound - negative.  Discussed ortho referral.  Wants to wait at this time.  Follow.        Skin lesion - Primary    Persistent lesions on right hand and left foot.  Refer to dermatology.  She wants to see Dr Cheree DittoGraham.        Relevant Orders   Ambulatory referral to Dermatology   Stress    Feels handling stress well.  Follow.         Other Visit Diagnoses    Need for prophylactic vaccination with combined diphtheria-tetanus-pertussis (DTP) vaccine        Relevant Orders    Tdap vaccine greater than or equal to 7yo IM (Completed)    Hyperglycemia        Relevant Orders    Hemoglobin A1c (Completed)        Dale DurhamSCOTT, Francheska Villeda, MD

## 2016-01-29 NOTE — Progress Notes (Signed)
Pre visit review using our clinic review tool, if applicable. No additional management support is needed unless otherwise documented below in the visit note. 

## 2016-01-30 LAB — HEMOGLOBIN A1C: Hgb A1c MFr Bld: 5.5 % (ref 4.6–6.5)

## 2016-02-01 ENCOUNTER — Encounter: Payer: Self-pay | Admitting: Internal Medicine

## 2016-02-01 DIAGNOSIS — L989 Disorder of the skin and subcutaneous tissue, unspecified: Secondary | ICD-10-CM | POA: Insufficient documentation

## 2016-02-01 NOTE — Assessment & Plan Note (Signed)
Feels handling stress well.  Follow.  

## 2016-02-01 NOTE — Assessment & Plan Note (Signed)
Seeing endocrinology.  Being worked up.

## 2016-02-01 NOTE — Assessment & Plan Note (Signed)
Persistent lesions on right hand and left foot.  Refer to dermatology.  She wants to see Dr Cheree DittoGraham.

## 2016-02-01 NOTE — Assessment & Plan Note (Signed)
Previously felt to have ganglion cyst - hand.  Exam - feels like a bony protuberance.  Had ultrasound - negative.  Discussed ortho referral.  Wants to wait at this time.  Follow.

## 2016-03-31 ENCOUNTER — Other Ambulatory Visit: Payer: Self-pay

## 2016-03-31 ENCOUNTER — Ambulatory Visit
Admission: EM | Admit: 2016-03-31 | Discharge: 2016-03-31 | Disposition: A | Discharge status: Home / Self Care | Payer: BLUE CROSS/BLUE SHIELD | Attending: Family Medicine | Admitting: Family Medicine

## 2016-03-31 ENCOUNTER — Telehealth: Payer: Self-pay | Admitting: Internal Medicine

## 2016-03-31 ENCOUNTER — Encounter: Payer: Self-pay | Admitting: Gynecology

## 2016-03-31 DIAGNOSIS — G43809 Other migraine, not intractable, without status migrainosus: Secondary | ICD-10-CM | POA: Diagnosis not present

## 2016-03-31 MED ORDER — HYDROCODONE-ACETAMINOPHEN 5-325 MG PO TABS: 1.0000 | ORAL_TABLET | Freq: Four times a day (QID) | ORAL | Status: DC | PRN

## 2016-03-31 NOTE — Telephone Encounter (Signed)
No recent fall. Just woke up with bad headache around 5:30am Monday morning. The knot was noticed on Monday night & was sensitive to lights (like a migraine). Seemed a little better Tuesday but headache has been persistent since. Took Tylenol last night. No history of migraines, just history of mild occasional headaches. He also states that he had a normal MRI a while ago. Best number to reach patient is: [phone].

## 2016-03-31 NOTE — Telephone Encounter (Signed)
Let pt know that I will not be in the office this pm and out end of week and beginning of next week.  Given headache and given this is new for him, etc - he needs to go ahead and be seen to make sure nothing more acute going on (recent tick bite, etc?).  [place] [employer]  Can go to urgent care or acute care and confirm nothing more acute going on and then can f/u here after.

## 2016-03-31 NOTE — Discharge Instructions (Signed)

## 2016-03-31 NOTE — Telephone Encounter (Signed)
Need more information.  Did he fall?  How long headaches and how long knot present?  Is blood pressure ok?  Any other symptoms?

## 2016-03-31 NOTE — Telephone Encounter (Signed)
Pt.notified

## 2016-03-31 NOTE — Telephone Encounter (Signed)
The patient is wanting to talk with Dr. Lorin PicketScott about having headaches that will not go away and knot above left eye.

## 2016-03-31 NOTE — ED Notes (Signed)
Patient c/o migraine x 3-4 days ago. Per patient also notice knot over left eye same time he had the migraine. Patient stated had tick bite x 2 week ago.

## 2016-03-31 NOTE — Telephone Encounter (Signed)
Please advise? Would you like him to schedule an appt.?

## 2016-05-03 NOTE — ED Provider Notes (Signed)
CSN: [id number]     Arrival date & time 03/31/16  1742 History   First MD Initiated Contact with Patient 03/31/16 1854     Chief Complaint  Patient presents with  . Migraine   (Consider location/radiation/quality/duration/timing/severity/associated sxs/prior Treatment) HPI Comments: 30 yo male with a c/o migraine headache for last 3 days. Headache pain over and around the left eye, associated with photophobia, nausea but no vomiting, fevers, chills, vision changes, numbness/tingling, one-sided weakness.   States had a tick bite 2 weeks ago, but removed tick which was not engorged and had been embedded less than 24 hours. Patient denies rash, fevers, chills. Not sure if tick bite related to this.    Patient is a 30 y.o. male presenting with migraines.  Migraine    Past Medical History  Diagnosis Date  . Syncope     one episode worked up (cardiology and endocrinology).  felt to be related to hypoglycemia  . Back pain    Past Surgical History  Procedure Laterality Date  . Ankle surgery  2003  . Wisdom tooth extraction    . Leg surgery     Family History  Problem Relation Age of Onset  . Adopted: Yes   Social History  Substance Use Topics  . Smoking status: Current Some Day Smoker    Types: Cigarettes  . Smokeless tobacco: Never Used  . Alcohol Use: 0.0 oz/week    0 Standard drinks or equivalent per week     Comment: socially    Review of Systems  Allergies  Review of patient's allergies indicates no known allergies.  Home Medications   Prior to Admission medications   Medication Sig Start Date End Date Taking? Authorizing Provider  etodolac (LODINE) 400 MG tablet Take 1 tablet (400 mg total) by mouth 2 (two) times daily as needed. 09/29/15  Yes Dale Quincy, MD  sildenafil (VIAGRA) 50 MG tablet Take 50 mg by mouth daily as needed for erectile dysfunction.   Yes Historical Provider, MD  HYDROcodone-acetaminophen (NORCO/VICODIN) 5-325 MG tablet Take 1-2 tablets by  mouth every 6 (six) hours as needed. 03/31/16   Payton Mccallum, MD   Meds Ordered and Administered this Visit  Medications - No data to display  BP 124/80 mmHg  Pulse 94  Temp(Src) 98.8 F (37.1 C) (Oral)  Resp 16  Ht 6' (1.829 m)  Wt 270 lb (122.471 kg)  BMI 36.61 kg/m2  SpO2 98% No data found.   Physical Exam  Constitutional: He is oriented to person, place, and time. He appears well-developed and well-nourished. No distress.  HENT:  Head: Normocephalic and atraumatic.  Right Ear: Tympanic membrane, external ear and ear canal normal.  Left Ear: Tympanic membrane, external ear and ear canal normal.  Nose: Nose normal.  Mouth/Throat: Uvula is midline, oropharynx is clear and moist and mucous membranes are normal. No oropharyngeal exudate or tonsillar abscesses.  Eyes: Conjunctivae and EOM are normal. Pupils are equal, round, and reactive to light. Right eye exhibits no discharge. Left eye exhibits no discharge. No scleral icterus.  Neck: Normal range of motion. Neck supple. No tracheal deviation present. No thyromegaly present.  Cardiovascular: Normal rate, regular rhythm and normal heart sounds.   Pulmonary/Chest: Effort normal and breath sounds normal. No stridor. No respiratory distress. He has no wheezes. He has no rales. He exhibits no tenderness.  Lymphadenopathy:    He has no cervical adenopathy.  Neurological: He is alert and oriented to person, place, and time. He has normal strength.  He displays normal reflexes. No cranial nerve deficit or sensory deficit. He exhibits normal muscle tone. Coordination and gait normal.  Skin: Skin is warm and dry. No rash noted. He is not diaphoretic.  Nursing note and vitals reviewed.   ED Course  Procedures (including critical care time)  Labs Review Labs Reviewed - No data to display  Imaging Review No results found.   Visual Acuity Review  Right Eye Distance:   Left Eye Distance:   Bilateral Distance:    Right Eye Near:    Left Eye Near:    Bilateral Near:         MDM   1. Other type of migraine    Discharge Medication List as of 03/31/2016  7:06 PM    START taking these medications   Details  HYDROcodone-acetaminophen (NORCO/VICODIN) 5-325 MG tablet Take 1-2 tablets by mouth every 6 (six) hours as needed., Starting 03/31/2016, Until Discontinued, Print       1. diagnosis reviewed with patient 2. rx as per orders above; reviewed possible side effects, interactions, risks and benefits  3. Follow-up prn if symptoms worsen or don't improve    Payton Mccallumrlando Trany Chernick, MD 05/03/16 [phone]

## 2016-06-14 ENCOUNTER — Telehealth: Payer: Self-pay | Admitting: Internal Medicine

## 2016-06-14 NOTE — Telephone Encounter (Signed)
Pt called about needing a Rx refill. Pt stated Dr Darrick Huntsman told pt to call and she will refill the Rx. Pt is not sure the name of the medication. The medication was filled at [facility] with provider. Please advise?  Call pt @ [phone]. Thank you!

## 2016-06-14 NOTE — Telephone Encounter (Signed)
Patient needs a refill on his Viagra, please advise as this was filled at [facility], but he states he spoke with you about this. thanks

## 2016-06-15 MED ORDER — SILDENAFIL CITRATE 50 MG PO TABS: 50.0000 mg | ORAL_TABLET | Freq: Every day | ORAL | 0 refills | Status: DC | PRN

## 2016-06-15 NOTE — Telephone Encounter (Signed)
Spoke with the patient, he was given 100mg  tablets and was spliting them in half for a 50mg  dose.    FYI he wanted to let you know that he will be dropping off FMLA paperwork for him to help with his wife.  He had to take her to the ER recently for increased stomach pains and headaches and he needs intermittent time to assist with her issues.  The paperwork will be dropped off this week up front. thanks

## 2016-06-15 NOTE — Telephone Encounter (Signed)
ok'd rx for viagra #10 with no refills.  I ok'd the 50mg  tablets.  Make sure this is the dose he wanted.

## 2016-06-15 NOTE — Telephone Encounter (Signed)
Need to clarify which MD has been refilling and if he is no longer seeing them.  Also need to clarify he has taken and had no problems or side effects to the medication.

## 2016-06-15 NOTE — Telephone Encounter (Signed)
He is still seeing Dr. Bronson Curb at [facility] an endocrinologist for this, is is not wanting to have to go all the way to [facility] to get.  Last fill was at his last appt with her on 01/26/2016.   Per patient and note from that provider, patient has had good response and no side effects with viagra.  Please advise, thanks

## 2016-06-16 ENCOUNTER — Telehealth: Payer: Self-pay | Admitting: Internal Medicine

## 2016-06-16 DIAGNOSIS — Z7689 Persons encountering health services in other specified circumstances: Secondary | ICD-10-CM

## 2016-06-16 NOTE — Telephone Encounter (Signed)
Spoke with patient he is okay with the 50mg  for now.  He stated he has a follow up with you in November and he wanted to see if you could either call him or see him sooner to discuss some things.  I told him I would send the message on and see what we could do. thanks

## 2016-06-16 NOTE — Telephone Encounter (Signed)
I have sent in the rx for 50mg  tablets.  If he is ok with this, we can change to 100mg  next rx.  Let me know if a problem.

## 2016-06-16 NOTE — Telephone Encounter (Signed)
Pt dropped off 2- FMLA forms to be filled out.. Placed in Dr. Roby Lofts yellow folder up front...please advise.Marland Kitchen Fax when done

## 2016-06-17 NOTE — Telephone Encounter (Signed)
I can see him 07/01/16 at 8:00 for work in to discuss his issues.  Please place on the schedule.

## 2016-06-17 NOTE — Telephone Encounter (Signed)
Left a Detailed VM for patient to see if he can come on that date and time.  Put on schedule until I get a return call. thanks

## 2016-06-24 NOTE — Telephone Encounter (Signed)
Form completed.  If there is any further questions about specific medical issues, then if he can give me a number, I will talk with someone if any problems.  Forms placed in your basket.

## 2016-06-24 NOTE — Telephone Encounter (Signed)
Form was handed to me today by Erie NoeVanessa & placed in red folder for completion.

## 2016-06-25 NOTE — Telephone Encounter (Signed)
Form faxed

## 2016-07-01 ENCOUNTER — Encounter: Payer: Self-pay | Admitting: Internal Medicine

## 2016-07-01 ENCOUNTER — Ambulatory Visit (INDEPENDENT_AMBULATORY_CARE_PROVIDER_SITE_OTHER): Payer: BLUE CROSS/BLUE SHIELD | Admitting: Internal Medicine

## 2016-07-01 ENCOUNTER — Encounter (INDEPENDENT_AMBULATORY_CARE_PROVIDER_SITE_OTHER): Payer: Self-pay

## 2016-07-01 DIAGNOSIS — E291 Testicular hypofunction: Secondary | ICD-10-CM | POA: Diagnosis not present

## 2016-07-01 DIAGNOSIS — N528 Other male erectile dysfunction: Secondary | ICD-10-CM | POA: Diagnosis not present

## 2016-07-01 DIAGNOSIS — M545 Low back pain: Secondary | ICD-10-CM | POA: Diagnosis not present

## 2016-07-01 DIAGNOSIS — R0981 Nasal congestion: Secondary | ICD-10-CM

## 2016-07-01 DIAGNOSIS — M67441 Ganglion, right hand: Secondary | ICD-10-CM | POA: Diagnosis not present

## 2016-07-01 DIAGNOSIS — Z658 Other specified problems related to psychosocial circumstances: Secondary | ICD-10-CM

## 2016-07-01 DIAGNOSIS — R7989 Other specified abnormal findings of blood chemistry: Secondary | ICD-10-CM

## 2016-07-01 DIAGNOSIS — K219 Gastro-esophageal reflux disease without esophagitis: Secondary | ICD-10-CM

## 2016-07-01 DIAGNOSIS — F439 Reaction to severe stress, unspecified: Secondary | ICD-10-CM

## 2016-07-01 NOTE — Patient Instructions (Signed)
Take nexium daily.   nasacort nasal spray - 2 sprays each nostril one time per day.  Do this in the evening.   Saline nasal spray - flush nose at least 2-3x/day

## 2016-07-01 NOTE — Progress Notes (Signed)
Patient ID: Ian Pineda, male   DOB: 1986/01/16, 30 y.o.   MRN: [id number]   Subjective:    Patient ID: Ian HakimBrandon L Pineda, male    DOB: 1986/01/16, 30 y.o.   MRN: [id number]  HPI  Patient here for a scheduled follow up.  He had questions about his FMLA form.  Discussed.  He also reports increased acid reflux.  Notices increased acid coming up in his mouth.  Started taking nexium.  This has helped.  Much improved.  Has not been taking daily.  Discussed avoiding antiinflammatories.  Has been exercising.  No chest pain with increased activity or exertion.  No sob.  No abdominal pain or cramping.  Bowels stable.  Using viagra.  Request to try something that lasts a little longer.  Discussed cialis.  Back stable.  Not requiring lodine.     Past Medical History:  Diagnosis Date  . Back pain   . Syncope    one episode worked up (cardiology and endocrinology).  felt to be related to hypoglycemia   Past Surgical History:  Procedure Laterality Date  . ANKLE SURGERY  2003  . LEG SURGERY    . WISDOM TOOTH EXTRACTION     Family History  Problem Relation Age of Onset  . Adopted: Yes   Social History   Social History  . Marital status: Married    Spouse name: N/A  . Number of children: N/A  . Years of education: N/A   Social History Main Topics  . Smoking status: Current Some Day Smoker    Types: Cigarettes  . Smokeless tobacco: Never Used  . Alcohol use 0.0 oz/week     Comment: socially  . Drug use: No  . Sexual activity: Not Asked   Other Topics Concern  . None   Social History Narrative  . None    Outpatient Encounter Prescriptions as of 07/01/2016  Medication Sig  . etodolac (LODINE) 400 MG tablet Take 1 tablet (400 mg total) by mouth 2 (two) times daily as needed.  Marland Kitchen. HYDROcodone-acetaminophen (NORCO/VICODIN) 5-325 MG tablet Take 1-2 tablets by mouth every 6 (six) hours as needed.  . [DISCONTINUED] sildenafil (VIAGRA) 50 MG tablet Take 1 tablet (50 mg total) by mouth daily as  needed for erectile dysfunction.  . tadalafil (CIALIS) 5 MG tablet Use as instructed.  May take one tablet q 72 hours as needed.   No facility-administered encounter medications on file as of 07/01/2016.     Review of Systems  Constitutional: Negative for appetite change and unexpected weight change.  HENT: Negative for congestion and sinus pressure.   Respiratory: Negative for cough, chest tightness and shortness of breath.   Cardiovascular: Negative for chest pain, palpitations and leg swelling.  Gastrointestinal: Negative for abdominal pain, diarrhea, nausea and vomiting.       Increased acid reflux as outlined.   Genitourinary: Negative for difficulty urinating and dysuria.  Musculoskeletal: Negative for joint swelling and myalgias.  Skin: Negative for color change and rash.  Neurological: Negative for dizziness, light-headedness and headaches.  Psychiatric/Behavioral: Negative for agitation and dysphoric mood.       Objective:    Physical Exam  Constitutional: He appears well-developed and well-nourished. No distress.  HENT:  Nose: Nose normal.  Mouth/Throat: Oropharynx is clear and moist.  Neck: Neck supple. No thyromegaly present.  Cardiovascular: Normal rate and regular rhythm.   Pulmonary/Chest: Effort normal and breath sounds normal. No respiratory distress.  Abdominal: Soft. Bowel sounds are normal. There is  no tenderness.  Musculoskeletal: He exhibits no edema or tenderness.  Lymphadenopathy:    He has no cervical adenopathy.  Skin: No rash noted. No erythema.  Psychiatric: He has a normal mood and affect. His behavior is normal.    BP 120/80   Pulse 90   Temp 97.8 F (36.6 C) (Oral)   Resp 18   Ht 6' (1.829 m)   Wt 278 lb 8 oz (126.3 kg)   SpO2 97%   BMI 37.77 kg/m  Wt Readings from Last 3 Encounters:  07/01/16 278 lb 8 oz (126.3 kg)  03/31/16 270 lb (122.5 kg)  01/29/16 274 lb (124.3 kg)     Lab Results  Component Value Date   WBC 8.0 01/13/2016     HGB 15.8 01/13/2016   HCT 46.3 01/13/2016   PLT 259.0 01/13/2016   GLUCOSE 114 (H) 01/13/2016   CHOL 122 01/13/2016   TRIG 92.0 01/13/2016   HDL 43.90 01/13/2016   LDLCALC 60 01/13/2016   ALT 23 01/13/2016   AST 16 01/13/2016   NA 138 01/13/2016   K 4.2 01/13/2016   CL 104 01/13/2016   CREATININE 0.86 01/13/2016   BUN 12 01/13/2016   CO2 27 01/13/2016   TSH 0.82 01/13/2016   HGBA1C 5.5 01/29/2016       Assessment & Plan:   Problem List Items Addressed This Visit    Back pain    Stable.  Not a significant issue for him now.  Follow.       Erectile dysfunction    Seeing endocrinology.  On viagra.  See her note for evaluation and w/up.  Wants to change to cialis.  rx sent in.  Discussed possible side effects and risk of medication.        Ganglion of right hand    S/p removal.  Has done well.        GERD (gastroesophageal reflux disease)    Increased acid reflux as outlined.  Continue nexium, but have him take it daily.  Get him back in soon to reassess.  Follow.       Low testosterone    Followed by endocrinology.       Nasal congestion    Persistent nasal congestion.  Start saline nasal spray and nasacort nasal spray as directed.  Follow.       Stress    Handling stress relatively well.  Follow.        Other Visit Diagnoses   None.      Dale DurhamSCOTT, Sloan Takagi, MD

## 2016-07-01 NOTE — Progress Notes (Signed)
Pre-visit discussion using our clinic review tool. No additional management support is needed unless otherwise documented below in the visit note.  

## 2016-07-02 DIAGNOSIS — K219 Gastro-esophageal reflux disease without esophagitis: Secondary | ICD-10-CM | POA: Insufficient documentation

## 2016-07-02 DIAGNOSIS — R0981 Nasal congestion: Secondary | ICD-10-CM | POA: Insufficient documentation

## 2016-07-02 MED ORDER — TADALAFIL 5 MG PO TABS: ORAL_TABLET | ORAL | 0 refills | Status: DC

## 2016-07-02 NOTE — Assessment & Plan Note (Signed)
S/p removal.  Has done well.

## 2016-07-02 NOTE — Assessment & Plan Note (Signed)
Stable.  Not a significant issue for him now.  Follow.

## 2016-07-02 NOTE — Assessment & Plan Note (Signed)
Persistent nasal congestion.  Start saline nasal spray and nasacort nasal spray as directed.  Follow.

## 2016-07-02 NOTE — Assessment & Plan Note (Signed)
Handling stress relatively well.  Follow.   

## 2016-07-02 NOTE — Assessment & Plan Note (Signed)
Seeing endocrinology.  On viagra.  See her note for evaluation and w/up.  Wants to change to cialis.  rx sent in.  Discussed possible side effects and risk of medication.

## 2016-07-02 NOTE — Assessment & Plan Note (Signed)
Followed by endocrinology 

## 2016-07-02 NOTE — Assessment & Plan Note (Signed)
Increased acid reflux as outlined.  Continue nexium, but have him take it daily.  Get him back in soon to reassess.  Follow.

## 2016-07-09 ENCOUNTER — Encounter: Payer: Self-pay | Admitting: *Deleted

## 2016-07-09 ENCOUNTER — Ambulatory Visit
Admission: EM | Admit: 2016-07-09 | Discharge: 2016-07-09 | Disposition: A | Discharge status: Home / Self Care | Payer: BLUE CROSS/BLUE SHIELD | Attending: Family Medicine | Admitting: Family Medicine

## 2016-07-09 DIAGNOSIS — J069 Acute upper respiratory infection, unspecified: Secondary | ICD-10-CM

## 2016-07-09 DIAGNOSIS — J029 Acute pharyngitis, unspecified: Secondary | ICD-10-CM

## 2016-07-09 LAB — RAPID STREP SCREEN (MED CTR MEBANE ONLY): STREPTOCOCCUS, GROUP A SCREEN (DIRECT): NEGATIVE

## 2016-07-09 MED ORDER — GUAIFENESIN-CODEINE 100-10 MG/5ML PO SOLN: 10.0000 mL | Freq: Every evening | ORAL | 0 refills | Status: DC | PRN

## 2016-07-09 MED ORDER — LORATADINE-PSEUDOEPHEDRINE ER 5-120 MG PO TB12: 1.0000 | ORAL_TABLET | Freq: Two times a day (BID) | ORAL | 0 refills | Status: DC

## 2016-07-09 NOTE — ED Provider Notes (Signed)
[facility] ____________________________________________  Time seen: Approximately 10:07 AM  I have reviewed the triage vital signs and the nursing notes.   HISTORY  Chief Complaint Cough; Sore Throat; and Nasal Congestion  HPI Ian Pineda is a 30 y.o. male presents for complaints of runny nose, cough, congestion and sore throat since yesterday. Reports continues to eat and drink well. States cough worse at night with postnasal drainage. Denies fevers. Denies known sick contacts. States biggest complaints are sore throat and that he can not breathe through his nose. Denies taking any medications over the counter for the same complaints.   Denies chest pain, shortness of breath, abdominal pain, rash, fevers, insect bites, tick bites, vomiting, diarrhea, abnormal bleeding, extremity pain, extremity swelling, or recent sickness. Reports recently came home from vacation in [place]. Reports has felt well otherwise.   Dale Noonan, MD: PCP   Past Medical History:  Diagnosis Date  . Back pain   . Syncope    one episode worked up (cardiology and endocrinology).  felt to be related to hypoglycemia    Patient Active Problem List   Diagnosis Date Noted  . GERD (gastroesophageal reflux disease) 07/02/2016  . Nasal congestion 07/02/2016  . Skin lesion 02/01/2016  . Left elbow pain 10/05/2015  . Health care maintenance 10/05/2015  . Ganglion of right hand 03/17/2015  . Back pain 10/29/2014  . Stress 10/29/2014  . Chest pain 10/29/2014  . Low testosterone 10/24/2014  . Erectile dysfunction 12/21/2013    Past Surgical History:  Procedure Laterality Date  . ANKLE SURGERY  2003  . LEG SURGERY    . WISDOM TOOTH EXTRACTION      Current Outpatient Rx  . Order #: [id number] Class: Normal  . Order #: [id number] Class: Print  . Order #: [id number] Class: Print  . Order #: [id number] Class: Normal  . Order #: [id number] Class: Normal    No current facility-administered  medications for this encounter.   Current Outpatient Prescriptions:  .  etodolac (LODINE) 400 MG tablet, Take 1 tablet (400 mg total) by mouth 2 (two) times daily as needed., Disp: 40 tablet, Rfl: 0 .  guaiFENesin-codeine 100-10 MG/5ML syrup, Take 10 mLs by mouth at bedtime as needed for cough (do not drive or operate machinery while taking as can cause drowsiness.)., Disp: 70 mL, Rfl: 0 .  HYDROcodone-acetaminophen (NORCO/VICODIN) 5-325 MG tablet, Take 1-2 tablets by mouth every 6 (six) hours as needed., Disp: 6 tablet, Rfl: 0 .  loratadine-pseudoephedrine (CLARITIN-D 12 HOUR) 5-120 MG tablet, Take 1 tablet by mouth 2 (two) times daily., Disp: 10 tablet, Rfl: 0 .  tadalafil (CIALIS) 5 MG tablet, Use as instructed.  May take one tablet q 72 hours as needed., Disp: 10 tablet, Rfl: 0  Allergies Review of patient's allergies indicates no known allergies.  Family History  Problem Relation Age of Onset  . Adopted: Yes    Social History Social History  Substance Use Topics  . Smoking status: Current Some Day Smoker    Types: Cigarettes  . Smokeless tobacco: Never Used  . Alcohol use 0.0 oz/week     Comment: socially    Review of Systems Constitutional: No fever/chills Eyes: No visual changes. ENT: As above.  Cardiovascular: Denies chest pain. Respiratory: Denies shortness of breath. Gastrointestinal: No abdominal pain.  No nausea, no vomiting.  No diarrhea.  No constipation. Genitourinary: Negative for dysuria. Musculoskeletal: Negative for back pain. Skin: Negative for rash. Neurological: Negative for headaches, focal weakness or numbness.  10-point ROS otherwise negative.  ____________________________________________   PHYSICAL EXAM:  VITAL SIGNS: ED Triage Vitals  Enc Vitals Group     BP 07/09/16 0947 140/85     Pulse Rate 07/09/16 0947 87     Resp 07/09/16 0947 16     Temp 07/09/16 0947 98 F (36.7 C)     Temp Source 07/09/16 0947 Oral     SpO2 07/09/16 0947 99 %       Weight 07/09/16 0949 270 lb (122.5 kg)     Height 07/09/16 0949 6' (1.829 m)     Head Circumference --      Peak Flow --      Pain Score --      Pain Loc --      Pain Edu? --      Excl. in GC? --    Constitutional: Alert and oriented. Well appearing and in no acute distress. Eyes: Conjunctivae are normal. PERRL. EOMI. Head: Atraumatic. No sinus tenderness to palpation. No swelling. No erythema.  Ears: no erythema, normal TMs bilaterally.   Nose:Nasal congestion with clear rhinorrhea  Mouth/Throat: Mucous membranes are moist. Mild pharyngeal erythema. No tonsillar swelling or exudate.  Neck: No stridor.  No cervical spine tenderness to palpation. Hematological/Lymphatic/Immunilogical: No cervical lymphadenopathy. Cardiovascular: Normal rate, regular rhythm. Grossly normal heart sounds.  Good peripheral circulation. Respiratory: Normal respiratory effort.  No retractions. Lungs CTAB.No wheezes, rales or rhonchi. Good air movement.  Gastrointestinal: Soft and nontender. Normal Bowel sounds. No CVA tenderness. Musculoskeletal: No lower or upper extremity tenderness nor edema. No cervical, thoracic or lumbar tenderness to palpation. Neurologic:  Normal speech and language. No gross focal neurologic deficits are appreciated. No gait instability. Skin:  Skin is warm, dry and intact. No rash noted. Psychiatric: Mood and affect are normal. Speech and behavior are normal.  ___________________________________________   LABS (all labs ordered are listed, but only abnormal results are displayed)  Labs Reviewed  RAPID STREP SCREEN (NOT AT [facility])  CULTURE, GROUP A STREP [facility]     PROCEDURES Procedures    INITIAL IMPRESSION / ASSESSMENT AND PLAN / ED COURSE  Pertinent labs & imaging results that were available during my care of the patient were reviewed by me and considered in my medical decision making (see chart for details).  Well appearing, no acute distress. Cough, congestion,  sore throat since yesterday. Quick strep negative, will culture. Suspect viral upper respiratory infection. Encourage rest, fluids, prn claritin-d and prn guaifenesin with codeine for cough at night. Discussed indication, risks and benefits of medications with patient.  Discussed follow up with Primary care physician this week. Discussed follow up and return parameters including no resolution or any worsening concerns. Patient verbalized understanding and agreed to plan.   ____________________________________________   FINAL CLINICAL IMPRESSION(S) / ED DIAGNOSES  Final diagnoses:  URI (upper respiratory infection)  Pharyngitis     Discharge Medication List as of 07/09/2016 10:26 AM    START taking these medications   Details  guaiFENesin-codeine 100-10 MG/5ML syrup Take 10 mLs by mouth at bedtime as needed for cough (do not drive or operate machinery while taking as can cause drowsiness.)., Starting Fri 07/09/2016, Print    loratadine-pseudoephedrine (CLARITIN-D 12 HOUR) 5-120 MG tablet Take 1 tablet by mouth 2 (two) times daily., Starting Fri 07/09/2016, Normal        Note: This dictation was prepared with Dragon dictation along with smaller phrase technology. Any transcriptional errors that result from this process are unintentional.    Clinical Course  Renford Dills, NP 07/09/16 1049

## 2016-07-09 NOTE — ED Triage Notes (Signed)
Sore throat, productive cough- green, runny nose and head congestion, x2 days.

## 2016-07-09 NOTE — Discharge Instructions (Signed)
Take medication as prescribed. Rest. Drink plenty of fluids.  ° °Follow up with your primary care physician this week as needed. Return to Urgent care for new or worsening concerns.  ° °

## 2016-07-12 LAB — CULTURE, GROUP A STREP (THRC)

## 2016-09-03 ENCOUNTER — Encounter: Payer: Self-pay | Admitting: Internal Medicine

## 2016-09-03 ENCOUNTER — Ambulatory Visit (INDEPENDENT_AMBULATORY_CARE_PROVIDER_SITE_OTHER): Payer: BLUE CROSS/BLUE SHIELD | Admitting: Internal Medicine

## 2016-09-03 DIAGNOSIS — M545 Low back pain: Secondary | ICD-10-CM | POA: Diagnosis not present

## 2016-09-03 DIAGNOSIS — R7989 Other specified abnormal findings of blood chemistry: Secondary | ICD-10-CM

## 2016-09-03 DIAGNOSIS — N528 Other male erectile dysfunction: Secondary | ICD-10-CM | POA: Diagnosis not present

## 2016-09-03 DIAGNOSIS — K219 Gastro-esophageal reflux disease without esophagitis: Secondary | ICD-10-CM

## 2016-09-03 DIAGNOSIS — E349 Endocrine disorder, unspecified: Secondary | ICD-10-CM | POA: Diagnosis not present

## 2016-09-03 MED ORDER — TADALAFIL 5 MG PO TABS: ORAL_TABLET | ORAL | 0 refills | Status: DC

## 2016-09-03 MED ORDER — ESOMEPRAZOLE MAGNESIUM 40 MG PO CPDR: 40.0000 mg | DELAYED_RELEASE_CAPSULE | Freq: Every day | ORAL | 3 refills | Status: DC

## 2016-09-03 NOTE — Progress Notes (Signed)
Patient ID: Ian Pineda, male   DOB: 05-Dec-1985, 30 y.o.   MRN: [id number]   Subjective:    Patient ID: Ian HakimBrandon L Pineda, male    DOB: 05-Dec-1985, 30 y.o.   MRN: [id number]  HPI  Patient here for a scheduled follow up.  He is doing better.  Reflux has resolved on nexium.  No swalloiwing problems.  No acid reflux.  No abdominal pain or cramping.  Bowels stable.  Working.  Tries to stay active.  No chest pain.  No sob.     Past Medical History:  Diagnosis Date  . Back pain   . Syncope    one episode worked up (cardiology and endocrinology).  felt to be related to hypoglycemia   Past Surgical History:  Procedure Laterality Date  . ANKLE SURGERY  2003  . LEG SURGERY    . WISDOM TOOTH EXTRACTION     Family History  Problem Relation Age of Onset  . Adopted: Yes   Social History   Social History  . Marital status: Married    Spouse name: N/A  . Number of children: N/A  . Years of education: N/A   Social History Main Topics  . Smoking status: Current Some Day Smoker    Types: Cigarettes  . Smokeless tobacco: Never Used  . Alcohol use 0.0 oz/week     Comment: socially  . Drug use: No  . Sexual activity: Not Asked   Other Topics Concern  . None   Social History Narrative  . None    Outpatient Encounter Prescriptions as of 09/03/2016  Medication Sig  . etodolac (LODINE) 400 MG tablet Take 1 tablet (400 mg total) by mouth 2 (two) times daily as needed.  . loratadine-pseudoephedrine (CLARITIN-D 12 HOUR) 5-120 MG tablet Take 1 tablet by mouth 2 (two) times daily.  . tadalafil (CIALIS) 5 MG tablet Use as instructed.  May take one tablet q 72 hours as needed.  . [DISCONTINUED] tadalafil (CIALIS) 5 MG tablet Use as instructed.  May take one tablet q 72 hours as needed.  Marland Kitchen. esomeprazole (NEXIUM) 40 MG capsule Take 1 capsule (40 mg total) by mouth daily.  . [DISCONTINUED] guaiFENesin-codeine 100-10 MG/5ML syrup Take 10 mLs by mouth at bedtime as needed for cough (do not drive or  operate machinery while taking as can cause drowsiness.).  . [DISCONTINUED] HYDROcodone-acetaminophen (NORCO/VICODIN) 5-325 MG tablet Take 1-2 tablets by mouth every 6 (six) hours as needed.   No facility-administered encounter medications on file as of 09/03/2016.     Review of Systems  Constitutional: Negative for appetite change and unexpected weight change.  HENT: Negative for congestion and sinus pressure.   Respiratory: Negative for cough, chest tightness and shortness of breath.   Cardiovascular: Negative for chest pain, palpitations and leg swelling.  Gastrointestinal: Negative for abdominal pain, diarrhea and nausea.       Acid reflux resolved on nexium.   Genitourinary: Negative for difficulty urinating and dysuria.  Musculoskeletal: Negative for joint swelling.       No back pain now.    Skin: Negative for color change and rash.  Neurological: Negative for dizziness, light-headedness and headaches.  Psychiatric/Behavioral: Negative for agitation and dysphoric mood.       Objective:     Blood pressure rechecked by me:  118/74  Physical Exam  Constitutional: He appears well-developed and well-nourished. No distress.  HENT:  Nose: Nose normal.  Mouth/Throat: Oropharynx is clear and moist.  Neck: Neck supple. No thyromegaly  present.  Cardiovascular: Normal rate and regular rhythm.   Pulmonary/Chest: Effort normal and breath sounds normal. No respiratory distress.  Abdominal: Soft. Bowel sounds are normal. There is no tenderness.  Musculoskeletal: He exhibits no edema or tenderness.  Lymphadenopathy:    He has no cervical adenopathy.  Skin: No rash noted. No erythema.  Psychiatric: He has a normal mood and affect. His behavior is normal.    BP 120/78   Pulse (!) 103   Temp 98.3 F (36.8 C) (Oral)   Ht 6' (1.829 m)   Wt 281 lb 6.4 oz (127.6 kg)   SpO2 95%   BMI 38.16 kg/m  Wt Readings from Last 3 Encounters:  09/03/16 281 lb 6.4 oz (127.6 kg)  07/09/16 270 lb  (122.5 kg)  07/01/16 278 lb 8 oz (126.3 kg)     Lab Results  Component Value Date   WBC 8.0 01/13/2016   HGB 15.8 01/13/2016   HCT 46.3 01/13/2016   PLT 259.0 01/13/2016   GLUCOSE 114 (H) 01/13/2016   CHOL 122 01/13/2016   TRIG 92.0 01/13/2016   HDL 43.90 01/13/2016   LDLCALC 60 01/13/2016   ALT 23 01/13/2016   AST 16 01/13/2016   NA 138 01/13/2016   K 4.2 01/13/2016   CL 104 01/13/2016   CREATININE 0.86 01/13/2016   BUN 12 01/13/2016   CO2 27 01/13/2016   TSH 0.82 01/13/2016   HGBA1C 5.5 01/29/2016       Assessment & Plan:   Problem List Items Addressed This Visit    Back pain    Stable.  Follow.       Erectile dysfunction    cialis working well for him.  Tolerating better.  Follow.        GERD (gastroesophageal reflux disease)    Resolved on nexium.  Continue.  Follow.        Relevant Medications   esomeprazole (NEXIUM) 40 MG capsule   Low testosterone    Has been followed by endocrinology.  cialis working well for erectile dysfunction.  Tolerating better.        Other Visit Diagnoses   None.      Dale Union, MD

## 2016-09-03 NOTE — Progress Notes (Signed)
Pre visit review using our clinic review tool, if applicable. No additional management support is needed unless otherwise documented below in the visit note. 

## 2016-09-04 ENCOUNTER — Encounter: Payer: Self-pay | Admitting: Internal Medicine

## 2016-09-04 NOTE — Assessment & Plan Note (Signed)
cialis working well for him.  Tolerating better.  Follow.

## 2016-09-04 NOTE — Assessment & Plan Note (Signed)
Has been followed by endocrinology.  cialis working well for erectile dysfunction.  Tolerating better.

## 2016-09-04 NOTE — Assessment & Plan Note (Signed)
Stable.  Follow.   

## 2016-09-04 NOTE — Assessment & Plan Note (Signed)
Resolved on nexium.  Continue.  Follow.

## 2016-09-06 ENCOUNTER — Telehealth: Payer: Self-pay

## 2016-09-06 NOTE — Telephone Encounter (Signed)
PA for nexium completed on Cover my meds.

## 2016-09-07 NOTE — Telephone Encounter (Signed)
PA was denied, needs to have tried a 30 day trial 80mg  of both omeprazole and pantoprazole to have this drug covered. Please advise, thanks

## 2016-09-07 NOTE — Telephone Encounter (Signed)
Please notify pt that his insurance is denying coverage.  If agreeable, have him change to protonix 40mg  q day.  Let us know if any problems.  If agreeable, ok to send in rx.

## 2016-09-08 MED ORDER — PANTOPRAZOLE SODIUM 40 MG PO TBEC: 40.0000 mg | DELAYED_RELEASE_TABLET | Freq: Every day | ORAL | 3 refills | Status: DC

## 2016-09-08 NOTE — Telephone Encounter (Signed)
Spoke with the patient, he is okay to try the pantoprazole , [facility] [place], he will let the office know if the meds are not working. thanks

## 2016-09-08 NOTE — Addendum Note (Signed)
Addended by: Acey LavOMAN, TANYA M on: 09/08/2016 05:19 PM   Modules accepted: Orders

## 2016-09-30 ENCOUNTER — Encounter: Payer: Self-pay | Admitting: Internal Medicine

## 2016-09-30 ENCOUNTER — Ambulatory Visit (INDEPENDENT_AMBULATORY_CARE_PROVIDER_SITE_OTHER): Payer: BLUE CROSS/BLUE SHIELD | Admitting: Internal Medicine

## 2016-09-30 VITALS — BP 118/66 | HR 78 | Temp 98.3°F | Ht 73.0 in | Wt 282.4 lb

## 2016-09-30 DIAGNOSIS — F439 Reaction to severe stress, unspecified: Secondary | ICD-10-CM

## 2016-09-30 DIAGNOSIS — M545 Low back pain: Secondary | ICD-10-CM

## 2016-09-30 DIAGNOSIS — E349 Endocrine disorder, unspecified: Secondary | ICD-10-CM | POA: Diagnosis not present

## 2016-09-30 DIAGNOSIS — Z0000 Encounter for general adult medical examination without abnormal findings: Secondary | ICD-10-CM | POA: Diagnosis not present

## 2016-09-30 DIAGNOSIS — N528 Other male erectile dysfunction: Secondary | ICD-10-CM

## 2016-09-30 DIAGNOSIS — R079 Chest pain, unspecified: Secondary | ICD-10-CM

## 2016-09-30 DIAGNOSIS — K219 Gastro-esophageal reflux disease without esophagitis: Secondary | ICD-10-CM

## 2016-09-30 DIAGNOSIS — R7989 Other specified abnormal findings of blood chemistry: Secondary | ICD-10-CM

## 2016-09-30 MED ORDER — ETODOLAC 400 MG PO TABS: 400.0000 mg | ORAL_TABLET | Freq: Two times a day (BID) | ORAL | 0 refills | Status: DC | PRN

## 2016-09-30 NOTE — Progress Notes (Signed)
Pre visit review using our clinic review tool, if applicable. No additional management support is needed unless otherwise documented below in the visit note. 

## 2016-09-30 NOTE — Progress Notes (Signed)
Patient ID: Ian Pineda, male   DOB: 1986/06/21, 30 y.o.   MRN: [id number]   Subjective:    Patient ID: Ian HakimBrandon L Pineda, male    DOB: 1986/06/21, 30 y.o.   MRN: [id number]  HPI  Patient here for his physical exam.  States he is doing relatively well.  Some increased stress at work.  Discussed with him today.  States he can feel himself getting more stressed.  Has noticed some intermittent chest discomfort with these episodes.  States he has noticed this previously.  Had cardiac w/up then that was unrevealing.  States as soon as things were better at work, the episodes stopped.  He reports he did not have to deal with the stress today and yesterday and has had no issues.  Feels good.  No chest pain or tightness with increased activity or exertion.  No sob.  Discussed the stress.  He does not feel needs any further intervention.  Is going to be off work more the next two weeks and feels this will help.  Desires no further cardiac w/up at this time.  Discussed diet adjustment.  No significant back pain.  No nausea or vomiting.  Bowels stable.    Past Medical History:  Diagnosis Date  . Back pain   . Syncope    one episode worked up (cardiology and endocrinology).  felt to be related to hypoglycemia   Past Surgical History:  Procedure Laterality Date  . ANKLE SURGERY  2003  . LEG SURGERY    . WISDOM TOOTH EXTRACTION     Family History  Problem Relation Age of Onset  . Adopted: Yes   Social History   Social History  . Marital status: Married    Spouse name: N/A  . Number of children: N/A  . Years of education: N/A   Social History Main Topics  . Smoking status: Current Some Day Smoker    Types: Cigarettes  . Smokeless tobacco: Never Used  . Alcohol use 0.0 oz/week     Comment: socially  . Drug use: No  . Sexual activity: Not Asked   Other Topics Concern  . None   Social History Narrative  . None    Outpatient Encounter Prescriptions as of 09/30/2016  Medication Sig  .  esomeprazole (NEXIUM) 40 MG capsule Take 1 capsule (40 mg total) by mouth daily.  Marland Kitchen. etodolac (LODINE) 400 MG tablet Take 1 tablet (400 mg total) by mouth 2 (two) times daily as needed.  . loratadine-pseudoephedrine (CLARITIN-D 12 HOUR) 5-120 MG tablet Take 1 tablet by mouth 2 (two) times daily.  . pantoprazole (PROTONIX) 40 MG tablet Take 1 tablet (40 mg total) by mouth daily.  . tadalafil (CIALIS) 5 MG tablet Use as instructed.  May take one tablet q 72 hours as needed.  . [DISCONTINUED] etodolac (LODINE) 400 MG tablet Take 1 tablet (400 mg total) by mouth 2 (two) times daily as needed.   No facility-administered encounter medications on file as of 09/30/2016.     Review of Systems  Constitutional: Negative for appetite change and unexpected weight change.  HENT: Negative for congestion and sinus pressure.   Eyes: Negative for pain and visual disturbance.  Respiratory: Negative for cough, chest tightness and shortness of breath.   Cardiovascular: Positive for chest pain. Negative for palpitations and leg swelling.  Gastrointestinal: Negative for abdominal pain, diarrhea, nausea and vomiting.  Genitourinary: Negative for difficulty urinating and dysuria.  Musculoskeletal: Negative for joint swelling.  No significant back pain.   Skin: Negative for color change and rash.  Neurological: Negative for dizziness, light-headedness and headaches.  Hematological: Negative for adenopathy. Does not bruise/bleed easily.  Psychiatric/Behavioral: Negative for agitation and dysphoric mood.       Objective:    Physical Exam  Constitutional: He is oriented to person, place, and time. He appears well-developed and well-nourished. No distress.  HENT:  Head: Normocephalic and atraumatic.  Nose: Nose normal.  Mouth/Throat: Oropharynx is clear and moist. No oropharyngeal exudate.  Eyes: Conjunctivae are normal. Right eye exhibits no discharge. Left eye exhibits no discharge.  Neck: Neck supple.  No thyromegaly present.  Cardiovascular: Normal rate and regular rhythm.   Pulmonary/Chest: Breath sounds normal. No respiratory distress. He has no wheezes.  Abdominal: Soft. Bowel sounds are normal. There is no tenderness.  Genitourinary:  Genitourinary Comments: Normal descended testicles.  No palpable nodules.    Musculoskeletal: He exhibits no edema or tenderness.  Lymphadenopathy:    He has no cervical adenopathy.  Neurological: He is alert and oriented to person, place, and time.  Skin: Skin is warm and dry. No rash noted. No erythema.  Psychiatric: He has a normal mood and affect. His behavior is normal.    BP 118/66   Pulse 78   Temp 98.3 F (36.8 C) (Oral)   Ht 6\' 1"  (1.854 m)   Wt 282 lb 6.4 oz (128.1 kg)   SpO2 95%   BMI 37.26 kg/m  Wt Readings from Last 3 Encounters:  09/30/16 282 lb 6.4 oz (128.1 kg)  09/03/16 281 lb 6.4 oz (127.6 kg)  07/09/16 270 lb (122.5 kg)     Lab Results  Component Value Date   WBC 8.0 01/13/2016   HGB 15.8 01/13/2016   HCT 46.3 01/13/2016   PLT 259.0 01/13/2016   GLUCOSE 114 (H) 01/13/2016   CHOL 122 01/13/2016   TRIG 92.0 01/13/2016   HDL 43.90 01/13/2016   LDLCALC 60 01/13/2016   ALT 23 01/13/2016   AST 16 01/13/2016   NA 138 01/13/2016   K 4.2 01/13/2016   CL 104 01/13/2016   CREATININE 0.86 01/13/2016   BUN 12 01/13/2016   CO2 27 01/13/2016   TSH 0.82 01/13/2016   HGBA1C 5.5 01/29/2016       Assessment & Plan:   Problem List Items Addressed This Visit    Back pain    Stable.  No significant pain now.  Follow.        Relevant Medications   etodolac (LODINE) 400 MG tablet   Chest pain    Had previous cardiac w/up.  He relates recent symptoms to stress.  Discussed further cardiac w/up.  He declines.  Wants to monitor.  Has been better since the stress is better.  Follow.       Erectile dysfunction    cialis working well for him.  Follow.       GERD (gastroesophageal reflux disease)    Has reflux.  Started  on protonix secondary to insurance.  nexium worked better.  He is trying to avoid foods that aggravate.  Follow.  Take regularly.  Has not been taking on a regular basis.  Follow.        Health care maintenance    Physical today 09/30/16.        Low testosterone    Has been followed by endocrinology.  cialis working for him.  Follow.        Stress    Increased  stress as outlined.  Discussed with him today.  Does not feel needs anything more at this time.  Is better currently.  Follow.  He feels the chest pain is related to the increased stress.  Discussed further w/up.  He declines further cardiac w/up.  Wants to follow.  Will notify me if symptoms change or if desires any further w/up.            Dale DurhamSCOTT, Quinntin Malter, MD

## 2016-10-02 ENCOUNTER — Encounter: Payer: Self-pay | Admitting: Internal Medicine

## 2016-10-02 NOTE — Assessment & Plan Note (Signed)
Stable.  No significant pain now.  Follow.

## 2016-10-02 NOTE — Assessment & Plan Note (Signed)
Physical today 09/30/16.

## 2016-10-02 NOTE — Assessment & Plan Note (Signed)
Had previous cardiac w/up.  He relates recent symptoms to stress.  Discussed further cardiac w/up.  He declines.  Wants to monitor.  Has been better since the stress is better.  Follow.

## 2016-10-02 NOTE — Assessment & Plan Note (Signed)
Has been followed by endocrinology.  cialis working for him.  Follow.

## 2016-10-02 NOTE — Assessment & Plan Note (Signed)
cialis working well for him.  Follow.

## 2016-10-02 NOTE — Assessment & Plan Note (Signed)
Increased stress as outlined.  Discussed with him today.  Does not feel needs anything more at this time.  Is better currently.  Follow.  He feels the chest pain is related to the increased stress.  Discussed further w/up.  He declines further cardiac w/up.  Wants to follow.  Will notify me if symptoms change or if desires any further w/up.

## 2016-10-02 NOTE — Assessment & Plan Note (Signed)
Has reflux.  Started on protonix secondary to insurance.  nexium worked better.  He is trying to avoid foods that aggravate.  Follow.  Take regularly.  Has not been taking on a regular basis.  Follow.

## 2016-10-27 ENCOUNTER — Telehealth: Payer: Self-pay | Admitting: Internal Medicine

## 2016-10-27 ENCOUNTER — Other Ambulatory Visit: Payer: Self-pay | Admitting: Internal Medicine

## 2016-10-27 MED ORDER — TADALAFIL 10 MG PO TABS: 10.0000 mg | ORAL_TABLET | Freq: Every day | ORAL | 0 refills | Status: DC | PRN

## 2016-10-27 NOTE — Telephone Encounter (Signed)
Spoke with pt and rx sent in for cialis 10mg 

## 2016-10-27 NOTE — Telephone Encounter (Signed)
Pt called back returning your call. Thank you!  Call pt @ [phone]

## 2016-10-27 NOTE — Telephone Encounter (Signed)
Please advise, thanks.

## 2016-10-27 NOTE — Telephone Encounter (Signed)
Per Dr Lorin PicketScott she spoke with patient .

## 2016-10-27 NOTE — Progress Notes (Signed)
rx sent in for cialis #10 with no refills.

## 2016-10-27 NOTE — Telephone Encounter (Signed)
I have tried to call pt twice.  Left message.  If he calls back, see if he is wanting to increase the dose of his cialis.  We can do this as long as he is tolerating the medication with no problems.

## 2016-10-27 NOTE — Telephone Encounter (Signed)
Pt called and stated that he thinks the dosage for his tadalafil (CIALIS) 5 MG tablet needs to be changed. He asked for you to call him back to discuss. Please advise, thank you!  Call pt@ [phone]

## 2016-11-03 ENCOUNTER — Telehealth: Payer: Self-pay | Admitting: Internal Medicine

## 2016-11-03 NOTE — Telephone Encounter (Signed)
Pt called back requesting a refill on tadalafil (CIALIS) 10 MG tablet. Please advise, thank you!  [id number] [place],  - [address]  Call pt @ [phone]

## 2016-11-04 MED ORDER — TADALAFIL 10 MG PO TABS: 10.0000 mg | ORAL_TABLET | Freq: Every day | ORAL | 0 refills | Status: DC | PRN

## 2016-11-04 NOTE — Telephone Encounter (Signed)
Notified patient that he should not be using more frequently than 72 hours and that RX has been sent to pharmacy

## 2016-11-04 NOTE — Telephone Encounter (Signed)
Insurance only covered 6 tablets so he is needing a refill. He said that it seems to be working.

## 2016-11-04 NOTE — Telephone Encounter (Signed)
I ok'd the cialis.  Make sure he is not using more frequent then q 72 hours.

## 2016-11-11 ENCOUNTER — Other Ambulatory Visit: Payer: Self-pay | Admitting: Internal Medicine

## 2016-11-22 DIAGNOSIS — J019 Acute sinusitis, unspecified: Secondary | ICD-10-CM | POA: Diagnosis not present

## 2016-11-22 DIAGNOSIS — Z20828 Contact with and (suspected) exposure to other viral communicable diseases: Secondary | ICD-10-CM | POA: Diagnosis not present

## 2016-12-09 ENCOUNTER — Other Ambulatory Visit: Payer: Self-pay | Admitting: *Deleted

## 2016-12-09 NOTE — Telephone Encounter (Signed)
Pt requested a medication refill Nexium and Cialis  [facility] [place]

## 2016-12-10 ENCOUNTER — Other Ambulatory Visit: Payer: Self-pay

## 2016-12-10 MED ORDER — ESOMEPRAZOLE MAGNESIUM 40 MG PO CPDR: 40.0000 mg | DELAYED_RELEASE_CAPSULE | Freq: Every day | ORAL | 3 refills | Status: DC

## 2016-12-10 NOTE — Telephone Encounter (Signed)
Last office visit 09/30/16  Next office visit 01/06/17 Please advise

## 2016-12-10 NOTE — Progress Notes (Unsigned)
Error

## 2016-12-10 NOTE — Telephone Encounter (Signed)
Pt called back and stated that the insurance company will not pay for his medications any more. He is not sure if Dr, Lorin PicketScott can write another note. Please advise, thank you!  Call pt @ [phone]

## 2016-12-11 NOTE — Telephone Encounter (Signed)
See previous note. Duplicate message.

## 2016-12-11 NOTE — Telephone Encounter (Signed)
Per note, pt is requesting two medications.  I am not sure which one insurance is not covering.  May be cialis.  Can we clarify and does he need a prior authorization?  Thanks

## 2016-12-13 ENCOUNTER — Telehealth: Payer: Self-pay | Admitting: Internal Medicine

## 2016-12-13 NOTE — Telephone Encounter (Signed)
PA for Nexium was completed and sent to plan on 12/13/16.

## 2016-12-16 ENCOUNTER — Ambulatory Visit (INDEPENDENT_AMBULATORY_CARE_PROVIDER_SITE_OTHER): Payer: BLUE CROSS/BLUE SHIELD | Admitting: Internal Medicine

## 2016-12-16 ENCOUNTER — Encounter: Payer: Self-pay | Admitting: Internal Medicine

## 2016-12-16 DIAGNOSIS — F439 Reaction to severe stress, unspecified: Secondary | ICD-10-CM

## 2016-12-16 DIAGNOSIS — M545 Low back pain: Secondary | ICD-10-CM

## 2016-12-16 DIAGNOSIS — R7989 Other specified abnormal findings of blood chemistry: Secondary | ICD-10-CM

## 2016-12-16 DIAGNOSIS — K219 Gastro-esophageal reflux disease without esophagitis: Secondary | ICD-10-CM

## 2016-12-16 DIAGNOSIS — N528 Other male erectile dysfunction: Secondary | ICD-10-CM | POA: Diagnosis not present

## 2016-12-16 DIAGNOSIS — E349 Endocrine disorder, unspecified: Secondary | ICD-10-CM

## 2016-12-16 MED ORDER — SILDENAFIL CITRATE 50 MG PO TABS: 50.0000 mg | ORAL_TABLET | Freq: Every day | ORAL | 0 refills | Status: DC | PRN

## 2016-12-16 MED ORDER — PANTOPRAZOLE SODIUM 40 MG PO TBEC: 40.0000 mg | DELAYED_RELEASE_TABLET | Freq: Every day | ORAL | 3 refills | Status: DC

## 2016-12-16 NOTE — Progress Notes (Signed)
Pre-visit discussion using our clinic review tool. No additional management support is needed unless otherwise documented below in the visit note.  

## 2016-12-16 NOTE — Progress Notes (Signed)
Patient ID: Ian HakimBrandon L Pineda, male   DOB: 1985/12/08, 31 y.o.   MRN: [id number]   Subjective:    Patient ID: Ian HakimBrandon L Pineda, male    DOB: 1985/12/08, 31 y.o.   MRN: [id number]  HPI  Patient here for a scheduled follow up.  States he is doing better.  Has been taking protonix.  Was told insurance would not cover.  Bought some otc nexium.  Received notice today, medication was ready for pick up.  May be covered now.  No acid reflux.  No chest pain.  No sob.  Stress is better.  Wants to change back to viagra.  Feels this works better than cialis.  No back pain.  Has not required lodine in a while.  Rarely takes.  Bowels stable.  Has been trying to watch his diet.  Has started exercising.     Past Medical History:  Diagnosis Date  . Back pain   . Syncope    one episode worked up (cardiology and endocrinology).  felt to be related to hypoglycemia   Past Surgical History:  Procedure Laterality Date  . ANKLE SURGERY  2003  . LEG SURGERY    . WISDOM TOOTH EXTRACTION     Family History  Problem Relation Age of Onset  . Adopted: Yes   Social History   Social History  . Marital status: Married    Spouse name: N/A  . Number of children: N/A  . Years of education: N/A   Social History Main Topics  . Smoking status: Current Some Day Smoker    Types: Cigarettes  . Smokeless tobacco: Never Used  . Alcohol use 0.0 oz/week     Comment: socially  . Drug use: No  . Sexual activity: Not Asked   Other Topics Concern  . None   Social History Narrative  . None    Outpatient Encounter Prescriptions as of 12/16/2016  Medication Sig  . esomeprazole (NEXIUM) 40 MG capsule Take 1 capsule (40 mg total) by mouth daily.  Marland Kitchen. etodolac (LODINE) 400 MG tablet Take 1 tablet (400 mg total) by mouth 2 (two) times daily as needed.  . loratadine-pseudoephedrine (CLARITIN-D 12 HOUR) 5-120 MG tablet Take 1 tablet by mouth 2 (two) times daily.  . pantoprazole (PROTONIX) 40 MG tablet Take 1 tablet (40 mg total)  by mouth daily.  . [DISCONTINUED] pantoprazole (PROTONIX) 40 MG tablet Take 1 tablet (40 mg total) by mouth daily.  . [DISCONTINUED] tadalafil (CIALIS) 10 MG tablet Take 1 tablet (10 mg total) by mouth daily as needed for erectile dysfunction.  . sildenafil (VIAGRA) 50 MG tablet Take 1 tablet (50 mg total) by mouth daily as needed for erectile dysfunction.   No facility-administered encounter medications on file as of 12/16/2016.     Review of Systems  Constitutional: Negative for appetite change and unexpected weight change.  HENT: Negative for congestion and sinus pressure.   Respiratory: Negative for cough, chest tightness and shortness of breath.   Cardiovascular: Negative for chest pain, palpitations and leg swelling.  Gastrointestinal: Negative for abdominal pain, diarrhea, nausea and vomiting.  Genitourinary: Negative for difficulty urinating and dysuria.  Musculoskeletal: Negative for joint swelling and myalgias.       No recent back flares.    Skin: Negative for color change and rash.  Neurological: Negative for dizziness, light-headedness and headaches.  Psychiatric/Behavioral: Negative for agitation and dysphoric mood.       Objective:    Physical Exam  Constitutional: He appears well-developed  and well-nourished. No distress.  HENT:  Nose: Nose normal.  Mouth/Throat: Oropharynx is clear and moist.  Neck: Neck supple. No thyromegaly present.  Cardiovascular: Normal rate and regular rhythm.   Pulmonary/Chest: Effort normal and breath sounds normal. No respiratory distress.  Abdominal: Soft. Bowel sounds are normal. There is no tenderness.  Musculoskeletal: He exhibits no edema or tenderness.  Lymphadenopathy:    He has no cervical adenopathy.  Skin: No rash noted.  Psychiatric: He has a normal mood and affect. His behavior is normal.    BP 118/62 (BP Location: Left Arm, Patient Position: Sitting, Cuff Size: Large)   Pulse 81   Temp 98.6 F (37 C) (Oral)   Resp 16    Ht 6\' 1"  (1.854 m)   Wt 285 lb 3.2 oz (129.4 kg)   SpO2 94%   BMI 37.63 kg/m  Wt Readings from Last 3 Encounters:  12/16/16 285 lb 3.2 oz (129.4 kg)  09/30/16 282 lb 6.4 oz (128.1 kg)  09/03/16 281 lb 6.4 oz (127.6 kg)     Lab Results  Component Value Date   WBC 8.0 01/13/2016   HGB 15.8 01/13/2016   HCT 46.3 01/13/2016   PLT 259.0 01/13/2016   GLUCOSE 114 (H) 01/13/2016   CHOL 122 01/13/2016   TRIG 92.0 01/13/2016   HDL 43.90 01/13/2016   LDLCALC 60 01/13/2016   ALT 23 01/13/2016   AST 16 01/13/2016   NA 138 01/13/2016   K 4.2 01/13/2016   CL 104 01/13/2016   CREATININE 0.86 01/13/2016   BUN 12 01/13/2016   CO2 27 01/13/2016   TSH 0.82 01/13/2016   HGBA1C 5.5 01/29/2016       Assessment & Plan:   Problem List Items Addressed This Visit    Back pain    Not a significant issue now.  Follow.        Erectile dysfunction    Has been on cialis.  Wants to go back on viagra.  Feels works better for him.  rx dent in for viagra.        GERD (gastroesophageal reflux disease)    Discussed medication.  Reflux - better.  Was doing well on protonix.  Insurance denied initially.  States he received notice - rx at pharmacy.  Will let me know if needs another PPI.  Follow.  Given immediate return of symptoms off medication, will need referral to GI for evaluation for possible EGD.        Relevant Medications   pantoprazole (PROTONIX) 40 MG tablet   Other Relevant Orders   Ambulatory referral to Gastroenterology   Low testosterone    Worked up by endocrinology.        Stress    Stress is better.  Discussed with him today.  Doing better.  Follow.           Dale Bronson, MD

## 2016-12-19 ENCOUNTER — Encounter: Payer: Self-pay | Admitting: Internal Medicine

## 2016-12-19 NOTE — Assessment & Plan Note (Signed)
Not a significant issue now.  Follow.  

## 2016-12-19 NOTE — Assessment & Plan Note (Signed)
Worked up by endocrinology.

## 2016-12-19 NOTE — Assessment & Plan Note (Signed)
Discussed medication.  Reflux - better.  Was doing well on protonix.  Insurance denied initially.  States he received notice - rx at pharmacy.  Will let me know if needs another PPI.  Follow.  Given immediate return of symptoms off medication, will need referral to GI for evaluation for possible EGD.

## 2016-12-19 NOTE — Assessment & Plan Note (Signed)
Stress is better.  Discussed with him today.  Doing better.  Follow.

## 2016-12-19 NOTE — Assessment & Plan Note (Signed)
Has been on cialis.  Wants to go back on viagra.  Feels works better for him.  rx dent in for viagra.

## 2016-12-21 ENCOUNTER — Encounter: Payer: Self-pay | Admitting: Internal Medicine

## 2016-12-30 ENCOUNTER — Telehealth: Payer: Self-pay | Admitting: *Deleted

## 2016-12-30 NOTE — Telephone Encounter (Signed)
Please advise if this is ok.  

## 2016-12-30 NOTE — Telephone Encounter (Signed)
Pt stated that his insurance will not cover sildenafil and he will need to continue with Cialis [facility] [place]

## 2016-12-31 MED ORDER — TADALAFIL 10 MG PO TABS: ORAL_TABLET | ORAL | 0 refills | Status: DC

## 2016-12-31 NOTE — Telephone Encounter (Signed)
rx ok'd for cilalis.  Can cancel rx for viagra.

## 2017-01-06 ENCOUNTER — Encounter: Payer: BLUE CROSS/BLUE SHIELD | Admitting: Internal Medicine

## 2017-01-25 ENCOUNTER — Telehealth: Payer: Self-pay | Admitting: *Deleted

## 2017-01-25 NOTE — Telephone Encounter (Signed)
Pt last script was on 12-31-16 #10 ok to refill?

## 2017-01-25 NOTE — Telephone Encounter (Signed)
Requested medication refill for : Cialis [facility] [place] Please Contact Pt when ready or sent to Pharmacy:  [phone]

## 2017-01-26 MED ORDER — TADALAFIL 10 MG PO TABS: ORAL_TABLET | ORAL | 0 refills | Status: DC

## 2017-01-26 NOTE — Telephone Encounter (Signed)
Pt informed

## 2017-01-26 NOTE — Telephone Encounter (Signed)
ok'd rx for cialis #10 with no refills.

## 2017-01-27 NOTE — Telephone Encounter (Signed)
Pt called and stated that his insurance is not covering his medication and it is going to cost him over $500.00 Please advise, thank you!  Call pt @ [phone]

## 2017-01-27 NOTE — Telephone Encounter (Signed)
Patient called back would like to get refill changed to Cialis will start auth in am.

## 2017-01-27 NOTE — Telephone Encounter (Signed)
Patient is waiting at the pharmacy his insurance will cover Sildenafil 20 mg tablets . He is asking for the prescription to be sent to [facility] in [place] now.

## 2017-01-28 NOTE — Telephone Encounter (Signed)
Form completed.  Do not send in my last office note.  Please just submit form completed.  If any questions, see me.

## 2017-01-28 NOTE — Telephone Encounter (Signed)
Fax received for more information for auth have put form and last office note that will need to be faxed back with it in your box for review.

## 2017-01-28 NOTE — Telephone Encounter (Signed)
Ian Pineda has been submitted on 01-28-17 KEY: [id number]. Patient would like you to call him when you get time has some general questions about this medication.

## 2017-01-31 NOTE — Telephone Encounter (Signed)
[facility]

## 2017-02-08 DIAGNOSIS — K210 Gastro-esophageal reflux disease with esophagitis: Secondary | ICD-10-CM | POA: Diagnosis not present

## 2017-03-08 ENCOUNTER — Ambulatory Visit
Admission: EM | Admit: 2017-03-08 | Discharge: 2017-03-08 | Disposition: A | Discharge status: Home / Self Care | Payer: BLUE CROSS/BLUE SHIELD | Attending: Family Medicine | Admitting: Family Medicine

## 2017-03-08 DIAGNOSIS — R05 Cough: Secondary | ICD-10-CM | POA: Diagnosis not present

## 2017-03-08 DIAGNOSIS — R69 Illness, unspecified: Secondary | ICD-10-CM | POA: Diagnosis not present

## 2017-03-08 DIAGNOSIS — M791 Myalgia: Secondary | ICD-10-CM

## 2017-03-08 DIAGNOSIS — J029 Acute pharyngitis, unspecified: Secondary | ICD-10-CM | POA: Diagnosis not present

## 2017-03-08 DIAGNOSIS — J111 Influenza due to unidentified influenza virus with other respiratory manifestations: Secondary | ICD-10-CM

## 2017-03-08 LAB — RAPID STREP SCREEN (MED CTR MEBANE ONLY): STREPTOCOCCUS, GROUP A SCREEN (DIRECT): NEGATIVE

## 2017-03-08 MED ORDER — HYDROCOD POLST-CPM POLST ER 10-8 MG/5ML PO SUER: 5.0000 mL | Freq: Every evening | ORAL | 0 refills | Status: DC | PRN

## 2017-03-08 MED ORDER — IBUPROFEN 800 MG PO TABS
800.0000 mg | ORAL_TABLET | Freq: Once | ORAL | Status: AC
  Administered 2017-03-08: 800 mg via ORAL

## 2017-03-08 MED ORDER — OSELTAMIVIR PHOSPHATE 75 MG PO CAPS: 75.0000 mg | ORAL_CAPSULE | Freq: Two times a day (BID) | ORAL | 0 refills | Status: DC

## 2017-03-08 NOTE — Discharge Instructions (Signed)
Take medication as prescribed. Rest. Drink plenty of fluids. Take over the counter tylenol and ibuprofen as needed.   ° °Follow up with your primary care physician this week as needed. Return to Urgent care for new or worsening concerns.  ° °

## 2017-03-08 NOTE — ED Provider Notes (Signed)
[facility] ____________________________________________  Time seen: Approximately 2:33 PM  I have reviewed the triage vital signs and the nursing notes.   HISTORY  Chief Complaint Generalized Body Aches   HPI Ian Pineda is a 31 y.o. male presenting for evaluation of quick onset today of runny nose, cough, congestion and sore throat with accompanying body aches and fever. Reports did take Tylenol just prior to arrival. States last night did have some cough and took over-the-counter cough medications but states he was still up at night because of the cough. States has had some occasional runny nose with seasonal allergies but denies any persistent runny nose, cough or other complaints. Reports his continued to eat and drink well. Reports multiple sick contacts at work with similar complaints. Denies any chest pain, shortness breath, palpitations, chest discomfort. States cough is nonproductive. Reports clear runny nose. States mild sore throat at this time, states no pain to eat or swallow. Reports generalized body aches with accompanying warm and cold chills sensation.  Denies chest pain, shortness of breath, abdominal pain, dysuria, extremity pain, extremity swelling or rash. Denies recent sickness. Denies recent antibiotic use.  Dale [place], MD PCP   Past Medical History:  Diagnosis Date  . Back pain   . Syncope    one episode worked up (cardiology and endocrinology).  felt to be related to hypoglycemia    Patient Active Problem List   Diagnosis Date Noted  . GERD (gastroesophageal reflux disease) 07/02/2016  . Nasal congestion 07/02/2016  . Skin lesion 02/01/2016  . Left elbow pain 10/05/2015  . Health care maintenance 10/05/2015  . Ganglion of right hand 03/17/2015  . Back pain 10/29/2014  . Stress 10/29/2014  . Chest pain 10/29/2014  . Low testosterone 10/24/2014  . Erectile dysfunction 12/21/2013    Past Surgical History:  Procedure Laterality  Date  . ANKLE SURGERY  2003  . LEG SURGERY    . WISDOM TOOTH EXTRACTION       No current facility-administered medications for this encounter.   Current Outpatient Prescriptions:  .  etodolac (LODINE) 400 MG tablet, Take 1 tablet (400 mg total) by mouth 2 (two) times daily as needed., Disp: 40 tablet, Rfl: 0 .  pantoprazole (PROTONIX) 40 MG tablet, Take 1 tablet (40 mg total) by mouth daily., Disp: 30 tablet, Rfl: 3 .  tadalafil (CIALIS) 10 MG tablet, Take as directed.  Do not take more often then q 72 hours prn., Disp: 10 tablet, Rfl: 0 .  chlorpheniramine-HYDROcodone (TUSSIONEX PENNKINETIC ER) 10-8 MG/5ML SUER, Take 5 mLs by mouth at bedtime as needed for cough. do not drive or operate machinery while taking as can cause drowsiness., Disp: 75 mL, Rfl: 0 .  oseltamivir (TAMIFLU) 75 MG capsule, Take 1 capsule (75 mg total) by mouth every 12 (twelve) hours., Disp: 10 capsule, Rfl: 0  Allergies Patient has no known allergies.  Family History  Problem Relation Age of Onset  . Adopted: Yes    Social History Social History  Substance Use Topics  . Smoking status: Current Some Day Smoker    Types: Cigarettes  . Smokeless tobacco: Never Used  . Alcohol use 0.0 oz/week     Comment: socially    Review of Systems Constitutional: As above.  Eyes: No visual changes. ENT:As above.  Cardiovascular: Denies chest pain. Respiratory: Denies shortness of breath. Gastrointestinal: No abdominal pain.  No nausea, no vomiting.  No diarrhea.  No constipation. Genitourinary: Negative for dysuria. Musculoskeletal: Negative for back pain.  Skin: Negative for rash.   ____________________________________________   PHYSICAL EXAM:  VITAL SIGNS: ED Triage Vitals  Enc Vitals Group     BP 03/08/17 1359 129/67     Pulse Rate 03/08/17 1359 (!) 135     Resp 03/08/17 1359 18     Temp 03/08/17 1359 (!) 101.1 F (38.4 C)     Temp Source 03/08/17 1359 Oral     SpO2 03/08/17 1359 99 %     Weight  03/08/17 1357 260 lb (117.9 kg)     Height 03/08/17 1357 6' (1.829 m)     Head Circumference --      Peak Flow --      Pain Score 03/08/17 1357 8     Pain Loc --      Pain Edu? --      Excl. in GC? --     Constitutional: Alert and oriented. Well appearing and in no acute distress. Eyes: Conjunctivae are normal. PERRL. EOMI. Head: Atraumatic. No sinus tenderness to palpation. No swelling. No erythema.  Ears: no erythema, normal TMs bilaterally.   Nose:Nasal congestion with clear rhinorrhea  Mouth/Throat: Mucous membranes are moist. Mild pharyngeal erythema. No tonsillar swelling or exudate.  Neck: No stridor.  No cervical spine tenderness to palpation. Hematological/Lymphatic/Immunilogical: No cervical lymphadenopathy. Cardiovascular: Normal rate, regular rhythm. Grossly normal heart sounds.  Good peripheral circulation. Respiratory: Normal respiratory effort.  No retractions. No wheezes, rales or rhonchi. Good air movement. Dry intermittent cough in room noted. Speaks in complete sentences. Gastrointestinal: Soft and nontender. No CVA tenderness. Musculoskeletal: Ambulatory with steady gait. No cervical, thoracic or lumbar tenderness to palpation. Neurologic:  Normal speech and language. No gait instability. Skin:  Skin appears warm, dry. Psychiatric: Mood and affect are normal. Speech and behavior are normal.   ___________________________________________   LABS (all labs ordered are listed, but only abnormal results are displayed)  Labs Reviewed  RAPID STREP SCREEN (NOT AT [facility])  CULTURE, GROUP A STREP [facility])    PROCEDURES Procedures     INITIAL IMPRESSION / ASSESSMENT AND PLAN / ED COURSE  Pertinent labs & imaging results that were available during my care of the patient were reviewed by me and considered in my medical decision making (see chart for details).  Well-appearing patient. No acute distress. Reports quick onset of symptoms today with some cough last night.  Patient states that he does feel slightly anxious currently and feels that that might be contributing to elevated heart rate. denies any chest pain, shortness breath or palpitations. Drinking fluids well in urgent care. Quick strep negative, will culture. Patient states he is currently feeling better. Discussed suspect influenza-like illness and discuss evaluation and treatment options. Will treat patient with oral Tamiflu. Patient requests prescription for cough medication, Tussionex at night as needed. Encourage rest, fluids, Tylenol or ibuprofen as needed. 800 mg of ibuprofen given once in urgent care.Discussed indication, risks and benefits of medications with patient. Discussed strict follow-up and return parameters including any chest pain, shortness breath, palpitations or worsening concerns. Work note given for today and tomorrow.  Discussed follow up with Primary care physician this week. Discussed follow up and return parameters including no resolution or any worsening concerns. Patient verbalized understanding and agreed to plan.   ____________________________________________   FINAL CLINICAL IMPRESSION(S) / ED DIAGNOSES  Final diagnoses:  Influenza-like illness     Discharge Medication List as of 03/08/2017  3:01 PM    START taking these medications   Details  chlorpheniramine-HYDROcodone (TUSSIONEX  PENNKINETIC ER) 10-8 MG/5ML SUER Take 5 mLs by mouth at bedtime as needed for cough. do not drive or operate machinery while taking as can cause drowsiness., Starting Tue 03/08/2017, Print    oseltamivir (TAMIFLU) 75 MG capsule Take 1 capsule (75 mg total) by mouth every 12 (twelve) hours., Starting Tue 03/08/2017, Normal        Note: This dictation was prepared with Dragon dictation along with smaller phrase technology. Any transcriptional errors that result from this process are unintentional.         Renford Dills, NP 03/08/17 1525

## 2017-03-08 NOTE — ED Triage Notes (Addendum)
Pt c/o chills, body aches and body sweats. These symptoms started around 5:30 this morning.

## 2017-03-10 ENCOUNTER — Telehealth: Payer: Self-pay

## 2017-03-10 LAB — CULTURE, GROUP A STREP (THRC)

## 2017-03-10 MED ORDER — AMOXICILLIN 875 MG PO TABS: 875.0000 mg | ORAL_TABLET | Freq: Two times a day (BID) | ORAL | 0 refills | Status: AC

## 2017-03-10 NOTE — Telephone Encounter (Signed)
-----   Message from Renford Dills, NP sent at 03/10/2017 12:25 PM EDT ----- Strep culture moderate strep, not group A. Please call and inform/follow up. No allergies listed, and please confirm. Please call in Amoxicillin  BID x 10 days, qty 20, no refills to patient pharmacy.   Thanks,  Renford Dills

## 2017-03-10 NOTE — Telephone Encounter (Signed)
Patient was informed of lab results from recent visit. Patient was informed that if symptoms persist or worsen to see their PCP or follow up with [facility]. Also informed pt of medication to pick up and start taking.

## 2017-03-22 ENCOUNTER — Encounter: Payer: Self-pay | Admitting: Gynecology

## 2017-03-22 ENCOUNTER — Ambulatory Visit
Admission: EM | Admit: 2017-03-22 | Discharge: 2017-03-22 | Disposition: A | Discharge status: Home / Self Care | Payer: BLUE CROSS/BLUE SHIELD | Attending: Internal Medicine | Admitting: Internal Medicine

## 2017-03-22 DIAGNOSIS — R05 Cough: Secondary | ICD-10-CM | POA: Diagnosis not present

## 2017-03-22 DIAGNOSIS — J020 Streptococcal pharyngitis: Secondary | ICD-10-CM

## 2017-03-22 MED ORDER — PENICILLIN V POTASSIUM 500 MG PO TABS: 500.0000 mg | ORAL_TABLET | Freq: Two times a day (BID) | ORAL | 0 refills | Status: AC

## 2017-03-22 MED ORDER — HYDROCOD POLST-CPM POLST ER 10-8 MG/5ML PO SUER: 5.0000 mL | Freq: Every evening | ORAL | 0 refills | Status: DC | PRN

## 2017-03-22 NOTE — ED Triage Notes (Signed)
Per patient was seen on 03/08/17 and was diagnose with strep. Patient stated was put on antibiotic which he finished yesterday, however his throat not better.

## 2017-03-22 NOTE — ED Provider Notes (Signed)
[facility]    CSN: [id number] Arrival date & time: 03/22/17  1546     History   Chief Complaint Chief Complaint  Patient presents with  . Sore Throat    HPI Ian Pineda is a 31 y.o. male.   C/o sore throat.  Reports recent treatment for strep throat (presumably by his PCP) with abx x7 days.  He finished all but one tablet. Felt better but sore throat returned 2 days ago. Took the last pill without improvement.  Denies fevers or abdominal pain.      Past Medical History:  Diagnosis Date  . Back pain   . Syncope    one episode worked up (cardiology and endocrinology).  felt to be related to hypoglycemia    Patient Active Problem List   Diagnosis Date Noted  . GERD (gastroesophageal reflux disease) 07/02/2016  . Nasal congestion 07/02/2016  . Skin lesion 02/01/2016  . Left elbow pain 10/05/2015  . Health care maintenance 10/05/2015  . Ganglion of right hand 03/17/2015  . Back pain 10/29/2014  . Stress 10/29/2014  . Chest pain 10/29/2014  . Low testosterone 10/24/2014  . Erectile dysfunction 12/21/2013    Past Surgical History:  Procedure Laterality Date  . ANKLE SURGERY  2003  . LEG SURGERY    . WISDOM TOOTH EXTRACTION         Home Medications    Prior to Admission medications   Medication Sig Start Date End Date Taking? Authorizing Provider  etodolac (LODINE) 400 MG tablet Take 1 tablet (400 mg total) by mouth 2 (two) times daily as needed. 09/30/16  Yes Dale [place], MD  pantoprazole (PROTONIX) 40 MG tablet Take 1 tablet (40 mg total) by mouth daily. 12/16/16  Yes Dale [place], MD  tadalafil (CIALIS) 10 MG tablet Take as directed.  Do not take more often then q 72 hours prn. 01/26/17  Yes Dale [place], MD  chlorpheniramine-HYDROcodone [facility] PENNKINETIC ER) 10-8 MG/5ML SUER Take 5 mLs by mouth at bedtime as needed for cough. do not drive or operate machinery while taking as can cause drowsiness. 03/22/17   Arnaldo Natal, MD    oseltamivir (TAMIFLU) 75 MG capsule Take 1 capsule (75 mg total) by mouth every 12 (twelve) hours. 03/08/17   Renford Dills, NP  penicillin v potassium (VEETID) 500 MG tablet Take 1 tablet (500 mg total) by mouth 2 (two) times daily. 03/22/17 04/01/17  Arnaldo Natal, MD    Family History Family History  Problem Relation Age of Onset  . Adopted: Yes    Social History Social History  Substance Use Topics  . Smoking status: Current Some Day Smoker    Types: Cigarettes  . Smokeless tobacco: Never Used  . Alcohol use 0.0 oz/week     Comment: socially     Allergies   Patient has no known allergies.   Review of Systems Review of Systems  Constitutional: Negative for chills and fever.  HENT: Positive for sore throat. Negative for tinnitus.   Eyes: Negative for redness.  Respiratory: Positive for cough. Negative for shortness of breath.   Cardiovascular: Negative for chest pain and palpitations.  Gastrointestinal: Negative for abdominal pain, diarrhea, nausea and vomiting.  Genitourinary: Negative for dysuria, frequency and urgency.  Musculoskeletal: Negative for myalgias.  Skin: Negative for rash.       No lesions  Neurological: Negative for weakness.  Hematological: Does not bruise/bleed easily.  Psychiatric/Behavioral: Negative for suicidal ideas.     Physical Exam  Triage Vital Signs ED Triage Vitals  Enc Vitals Group     BP 03/22/17 1649 117/69     Pulse Rate 03/22/17 1649 73     Resp 03/22/17 1649 16     Temp 03/22/17 1649 98.7 F (37.1 C)     Temp Source 03/22/17 1649 Oral     SpO2 03/22/17 1649 98 %     Weight 03/22/17 1651 260 lb (117.9 kg)     Height 03/22/17 1651 6' (1.829 m)     Head Circumference --      Peak Flow --      Pain Score 03/22/17 1651 5     Pain Loc --      Pain Edu? --      Excl. in GC? --    No data found.   Updated Vital Signs BP 117/69 (BP Location: Left Arm)   Pulse 73   Temp 98.7 F (37.1 C) (Oral)   Resp 16   Ht 6'  (1.829 m)   Wt 260 lb (117.9 kg)   SpO2 98%   BMI 35.26 kg/m   Visual Acuity Right Eye Distance:   Left Eye Distance:   Bilateral Distance:    Right Eye Near:   Left Eye Near:    Bilateral Near:     Physical Exam  Constitutional: He is oriented to person, place, and time. He appears well-developed and well-nourished. No distress.  HENT:  Head: Normocephalic and atraumatic.  Mouth/Throat: Oropharyngeal exudate present.  Eyes: Conjunctivae and EOM are normal. Pupils are equal, round, and reactive to light. No scleral icterus.  Neck: Normal range of motion. Neck supple. No JVD present. No tracheal deviation present. No thyromegaly present.  Cardiovascular: Normal rate, regular rhythm and normal heart sounds.  Exam reveals no gallop and no friction rub.   No murmur heard. Pulmonary/Chest: Effort normal and breath sounds normal. No respiratory distress.  Abdominal: Soft. Bowel sounds are normal. He exhibits no distension. There is no tenderness.  Musculoskeletal: Normal range of motion. He exhibits no edema.  Lymphadenopathy:    He has no cervical adenopathy.  Neurological: He is alert and oriented to person, place, and time. No cranial nerve deficit.  Skin: Skin is warm and dry. No rash noted. No erythema.  Psychiatric: He has a normal mood and affect. His behavior is normal. Judgment and thought content normal.     UC Treatments / Results  Labs (all labs ordered are listed, but only abnormal results are displayed) Labs Reviewed - No data to display  EKG  EKG Interpretation None       Radiology No results found.  Procedures Procedures (including critical care time)  Medications Ordered in UC Medications - No data to display   Initial Impression / Assessment and Plan / UC Course  I have reviewed the triage vital signs and the nursing notes.  Pertinent labs & imaging results that were available during my care of the patient were reviewed by me and considered in  my medical decision making (see chart for details).     Exudative tonsilititis R>L.  Will retreat for 10 day course.  Limited supply of cough syrup prescribed.    Final Clinical Impressions(s) / UC Diagnoses   Final diagnoses:  Strep throat    New Prescriptions New Prescriptions   PENICILLIN V POTASSIUM (VEETID) 500 MG TABLET    Take 1 tablet (500 mg total) by mouth 2 (two) times daily.     Arnaldo Nataliamond, Sian Rockers S, MD  03/22/17 1731  

## 2017-03-28 ENCOUNTER — Telehealth: Payer: Self-pay | Admitting: Internal Medicine

## 2017-03-28 NOTE — Telephone Encounter (Signed)
Pt called and wanted to speak with someone in regards to medication tadalafil (CIALIS) 10 MG tablet . Please advise, thank you!  Call pt @ [phone]

## 2017-03-29 NOTE — Telephone Encounter (Signed)
Patient called back states that he was told to wait a month or so and we would try to resend script? Not sure if you know what this was about. Do I need to try to resubmit auth or just send new scrip?

## 2017-03-29 NOTE — Telephone Encounter (Signed)
I am not sure what this is regarding.  In reviewing 01/2017 phone message - PA for cialis.  Will need to call pt and find out what he needs.

## 2017-03-29 NOTE — Telephone Encounter (Signed)
Called patient l/m to call office  

## 2017-03-30 NOTE — Telephone Encounter (Signed)
Did you know what he is talking about? Did he talk to you? Do we just need to resubmit auth?

## 2017-03-31 NOTE — Telephone Encounter (Signed)
Looks like you were handling this from phone note dated 3/18

## 2017-04-01 NOTE — Telephone Encounter (Signed)
Pt would like to have cialis 10mg  resubmitted with insurance

## 2017-04-01 NOTE — Telephone Encounter (Signed)
L/m to call office need to know what meds the wants me to try to get auth for.

## 2017-04-13 MED ORDER — TADALAFIL 10 MG PO TABS: ORAL_TABLET | ORAL | 0 refills | Status: DC

## 2017-04-13 NOTE — Telephone Encounter (Signed)
Patient script refilled with pharmacy Per patient request.

## 2017-04-20 ENCOUNTER — Ambulatory Visit (INDEPENDENT_AMBULATORY_CARE_PROVIDER_SITE_OTHER): Payer: BLUE CROSS/BLUE SHIELD | Admitting: Internal Medicine

## 2017-04-20 ENCOUNTER — Encounter: Payer: Self-pay | Admitting: Internal Medicine

## 2017-04-20 DIAGNOSIS — F439 Reaction to severe stress, unspecified: Secondary | ICD-10-CM | POA: Diagnosis not present

## 2017-04-20 DIAGNOSIS — R7989 Other specified abnormal findings of blood chemistry: Secondary | ICD-10-CM | POA: Diagnosis not present

## 2017-04-20 DIAGNOSIS — K219 Gastro-esophageal reflux disease without esophagitis: Secondary | ICD-10-CM | POA: Diagnosis not present

## 2017-04-20 DIAGNOSIS — N528 Other male erectile dysfunction: Secondary | ICD-10-CM

## 2017-04-20 NOTE — Progress Notes (Signed)
Patient ID: Ian Pineda, male   DOB: 1985/11/17, 31 y.o.   MRN: [id number]   Subjective:    Patient ID: Ian Pineda, male    DOB: 1986-02-06, 31 y.o.   MRN: [id number]  HPI  Patient here for a scheduled follow up.  He reports he is doing relatively well.  Stress is better at work.  Handling stress well.  No chest pain.  No sob.  He was on protonix.  Insurance would not cover.  Has return of symptoms if not on PPI.  Planning for EGD 05/24/17.  No abdominal pain.  Bowels moving.     Past Medical History:  Diagnosis Date  . Back pain   . GERD (gastroesophageal reflux disease)   . Syncope    one episode worked up (cardiology and endocrinology).  felt to be related to hypoglycemia   Past Surgical History:  Procedure Laterality Date  . ANKLE SURGERY  2003  . LEG SURGERY    . WISDOM TOOTH EXTRACTION     Family History  Problem Relation Age of Onset  . Adopted: Yes   Social History   Social History  . Marital status: Married    Spouse name: Aysen Shieh  . Number of children: 0  . Years of education: 14   Occupational History  . [employer]    Social History Main Topics  . Smoking status: Current Some Day Smoker    Types: Cigarettes  . Smokeless tobacco: Never Used     Comment: socially   . Alcohol use 0.0 oz/week     Comment: socially  . Drug use: No  . Sexual activity: Yes   Other Topics Concern  . None   Social History Narrative  . None    Outpatient Encounter Prescriptions as of 04/20/2017  Medication Sig  . pantoprazole (PROTONIX) 40 MG tablet Take 1 tablet (40 mg total) by mouth daily.  Marland Kitchen etodolac (LODINE) 400 MG tablet Take 1 tablet (400 mg total) by mouth 2 (two) times daily as needed. (Patient not taking: Reported on 04/20/2017)  . tadalafil (CIALIS) 10 MG tablet Take as directed.  Do not take more often then q 72 hours prn. (Patient not taking: Reported on 04/20/2017)  . [DISCONTINUED] chlorpheniramine-HYDROcodone (TUSSIONEX PENNKINETIC ER) 10-8 MG/5ML  SUER Take 5 mLs by mouth at bedtime as needed for cough. do not drive or operate machinery while taking as can cause drowsiness.  . [DISCONTINUED] oseltamivir (TAMIFLU) 75 MG capsule Take 1 capsule (75 mg total) by mouth every 12 (twelve) hours.   No facility-administered encounter medications on file as of 04/20/2017.     Review of Systems  Constitutional: Negative for appetite change and unexpected weight change.  HENT: Negative for congestion and sinus pressure.   Respiratory: Negative for cough, chest tightness and shortness of breath.   Cardiovascular: Negative for chest pain, palpitations and leg swelling.  Gastrointestinal: Negative for abdominal pain, diarrhea, nausea and vomiting.  Genitourinary: Negative for difficulty urinating and dysuria.  Musculoskeletal: Negative for back pain and joint swelling.  Skin: Negative for color change and rash.  Neurological: Negative for dizziness, light-headedness and headaches.  Psychiatric/Behavioral: Negative for agitation and dysphoric mood.       Objective:     Blood pressure rechecked by me:  112/72  Physical Exam  Constitutional: He appears well-developed and well-nourished. No distress.  HENT:  Nose: Nose normal.  Mouth/Throat: Oropharynx is clear and moist.  Neck: Neck supple. No thyromegaly present.  Cardiovascular: Normal rate  and regular rhythm.   Pulmonary/Chest: Effort normal and breath sounds normal. No respiratory distress.  Abdominal: Soft. Bowel sounds are normal. There is no tenderness.  Musculoskeletal: He exhibits no edema or tenderness.  Lymphadenopathy:    He has no cervical adenopathy.  Skin: No rash noted. No erythema.  Psychiatric: He has a normal mood and affect. His behavior is normal.    BP 110/60   Pulse 97   Temp 98.2 F (36.8 C) (Oral)   Resp 16   Ht 5' 11.25" (1.81 m)   Wt 271 lb (122.9 kg)   SpO2 98%   BMI 37.53 kg/m  Wt Readings from Last 3 Encounters:  04/20/17 271 lb (122.9 kg)    03/22/17 260 lb (117.9 kg)  03/08/17 260 lb (117.9 kg)     Lab Results  Component Value Date   WBC 8.0 01/13/2016   HGB 15.8 01/13/2016   HCT 46.3 01/13/2016   PLT 259.0 01/13/2016   GLUCOSE 114 (H) 01/13/2016   CHOL 122 01/13/2016   TRIG 92.0 01/13/2016   HDL 43.90 01/13/2016   LDLCALC 60 01/13/2016   ALT 23 01/13/2016   AST 16 01/13/2016   NA 138 01/13/2016   K 4.2 01/13/2016   CL 104 01/13/2016   CREATININE 0.86 01/13/2016   BUN 12 01/13/2016   CO2 27 01/13/2016   TSH 0.82 01/13/2016   HGBA1C 5.5 01/29/2016       Assessment & Plan:   Problem List Items Addressed This Visit    Erectile dysfunction    Has been on cialis.  Insurance denying coverage now.  Was covering.  Overall doing better.  Will see if can get insurance to cover.        GERD (gastroesophageal reflux disease)    Reflux controlled on PPI.  If stops, has return of symptoms.  Has EGD planned for 05/24/17.        Low testosterone    Worked up by endocrinology.        Stress    Stress is better.  Overall doing well.  Follow.            Dale DurhamSCOTT, Cathy Crounse, MD

## 2017-04-21 ENCOUNTER — Ambulatory Visit: Payer: BLUE CROSS/BLUE SHIELD | Admitting: Internal Medicine

## 2017-04-23 ENCOUNTER — Encounter: Payer: Self-pay | Admitting: Internal Medicine

## 2017-04-23 NOTE — Assessment & Plan Note (Signed)
Stress is better.  Overall doing well.  Follow.

## 2017-04-23 NOTE — Assessment & Plan Note (Signed)
Has been on cialis.  Insurance denying coverage now.  Was covering.  Overall doing better.  Will see if can get insurance to cover.

## 2017-04-23 NOTE — Assessment & Plan Note (Signed)
Reflux controlled on PPI.  If stops, has return of symptoms.  Has EGD planned for 05/24/17.

## 2017-04-23 NOTE — Assessment & Plan Note (Signed)
Worked up by endocrinology.

## 2017-05-24 DIAGNOSIS — R12 Heartburn: Secondary | ICD-10-CM | POA: Diagnosis not present

## 2017-05-24 DIAGNOSIS — K3189 Other diseases of stomach and duodenum: Secondary | ICD-10-CM | POA: Diagnosis not present

## 2017-05-24 DIAGNOSIS — K2960 Other gastritis without bleeding: Secondary | ICD-10-CM | POA: Diagnosis not present

## 2017-05-24 DIAGNOSIS — K210 Gastro-esophageal reflux disease with esophagitis: Secondary | ICD-10-CM | POA: Diagnosis not present

## 2017-05-24 DIAGNOSIS — K319 Disease of stomach and duodenum, unspecified: Secondary | ICD-10-CM | POA: Diagnosis not present

## 2017-05-25 ENCOUNTER — Telehealth: Payer: Self-pay | Admitting: *Deleted

## 2017-05-25 ENCOUNTER — Telehealth: Payer: Self-pay | Admitting: Internal Medicine

## 2017-05-25 NOTE — Telephone Encounter (Signed)
*  Pt reported having a large amount of blood in his stool   PT was transferred to the Nurse Line,

## 2017-05-25 NOTE — Telephone Encounter (Signed)
[facility] [place] [facility] - [place] TELEPHONE ADVICE RECORD [facility] Patient Name: Ian CashBRANDON Norland DOB: Jan 20, 1986 Initial Comment Caller states he has a lot of blood in his stool. Nurse Assessment Nurse: Ian HertzKirkland, Ian Pineda, Ian Pineda Date/Time Ian Pineda(Eastern Time): 05/25/2017 4:00:03 PM Confirm and document reason for call. If symptomatic, describe symptoms. ---Caller states he has a lot of bright red blood in his stool. No abdominal pain. Having soft formed stools with blood for 3 days in water and on stools. No fever. Does the patient have any new or worsening symptoms? ---Yes Will a triage be completed? ---Yes Related visit to physician within the last 2 weeks? ---No Does the PT have any chronic conditions? (i.e. diabetes, asthma, etc.) ---NoIs this a behavioral health or substance abuse call? ---No Guidelines Guideline Title Affirmed Question Affirmed Notes Rectal Bleeding [1] MODERATE rectal bleeding (small blood clots, passing blood without stool, or toilet water turns red) AND [2] more than once a day Final Disposition User Go to ED Now Ian HertzKirkland, Ian Pineda, [employer] Airlines[employer]heryl Comments Caller stated he was driving home and 10 minutes away. Advised to call wife and have her meet him. He verbalized understanding and disconnected call. Did not know which facility he would go to but stated he would go to. He stated he would decide after speaking with wife. Referrals GO TO FACILITY UNDECIDED [facility] Disagree/Comply: Comply Call Id: [phone]

## 2017-05-25 NOTE — Telephone Encounter (Signed)
Patient requested a update on the PA for medications Protonix and Cialis   Pt contact [phone]

## 2017-05-25 NOTE — Telephone Encounter (Signed)
Reason for call: passing blood in stool Symptoms: blood in stool , bright red, in toilet bowl, when wipe, no history of hemorrhoids,  Stool solid in form , not constipated, regular bowl movement, stool consistent, just noticed blood when wiping and in toilet on Saturday was large amount in toilet and now blood in toilet is slowing tapering off Duration Saturday Medications: Last seen for this problem: Seen by: [facility] to [facility] to ER for evaluation , [facility] , Patient of Dr Lorin PicketScott, advised patient Dr. Lorin PicketScott out of office this afternoon , wants appointment with Dr Lorin PicketScott, RN who spoke with patient  advised him to go to hospital.  Patient declines to go to hospital due to blood is gradually tapering off when wipes.   Spoke with Dr Birdie SonsSonnenberg he recommended patient go to ED or urgent care for further evaluation.  Patient stated he would speak with his wife and decide what  to do.  I strongly recommended he seek treatment today.

## 2017-05-26 NOTE — Telephone Encounter (Signed)
If he is refusing to be seen today, I can see him tomorrow at 11:30.

## 2017-05-26 NOTE — Telephone Encounter (Signed)
Left message to return call to office.

## 2017-05-26 NOTE — Telephone Encounter (Signed)
Duplicate note. Dr.Scott made aware from triage yesterday. Closing this note since the other note is being used for communication.

## 2017-05-26 NOTE — Telephone Encounter (Signed)
Patient did not go and get evaluated at ED as advisd , bleeding has stopped and he does not want to go and be evaluated at ED. Tried to tell patient that this could be serious and taking a chance with the bleeding but patient insists he would rather see PCP and go from there.

## 2017-05-26 NOTE — Telephone Encounter (Signed)
Agree with evaluation.  Please confirm pt was evaluated.  See me about this pt.

## 2017-05-26 NOTE — Telephone Encounter (Signed)
L/m to call office need more information do not see where we have received auth request from either med

## 2017-05-27 ENCOUNTER — Encounter: Payer: Self-pay | Admitting: *Deleted

## 2017-05-27 ENCOUNTER — Encounter: Payer: Self-pay | Admitting: Internal Medicine

## 2017-05-27 ENCOUNTER — Telehealth: Payer: Self-pay | Admitting: Internal Medicine

## 2017-05-27 ENCOUNTER — Ambulatory Visit (INDEPENDENT_AMBULATORY_CARE_PROVIDER_SITE_OTHER): Payer: BLUE CROSS/BLUE SHIELD | Admitting: Internal Medicine

## 2017-05-27 VITALS — BP 110/60 | HR 83 | Temp 98.2°F | Resp 14 | Wt 279.2 lb

## 2017-05-27 DIAGNOSIS — K219 Gastro-esophageal reflux disease without esophagitis: Secondary | ICD-10-CM

## 2017-05-27 DIAGNOSIS — J3489 Other specified disorders of nose and nasal sinuses: Secondary | ICD-10-CM

## 2017-05-27 DIAGNOSIS — N528 Other male erectile dysfunction: Secondary | ICD-10-CM | POA: Diagnosis not present

## 2017-05-27 DIAGNOSIS — K625 Hemorrhage of anus and rectum: Secondary | ICD-10-CM

## 2017-05-27 LAB — CBC WITH DIFFERENTIAL/PLATELET
BASOS ABS: 0.1 10*3/uL (ref 0.0–0.1)
BASOS PCT: 0.8 % (ref 0.0–3.0)
EOS ABS: 0.3 10*3/uL (ref 0.0–0.7)
Eosinophils Relative: 3.3 % (ref 0.0–5.0)
HEMATOCRIT: 45 % (ref 39.0–52.0)
HEMOGLOBIN: 15.4 g/dL (ref 13.0–17.0)
LYMPHS PCT: 24.2 % (ref 12.0–46.0)
Lymphs Abs: 2.3 10*3/uL (ref 0.7–4.0)
MCHC: 34.2 g/dL (ref 30.0–36.0)
MCV: 85.5 fl (ref 78.0–100.0)
MONO ABS: 0.7 10*3/uL (ref 0.1–1.0)
Monocytes Relative: 7.3 % (ref 3.0–12.0)
Neutro Abs: 6.1 10*3/uL (ref 1.4–7.7)
Neutrophils Relative %: 64.4 % (ref 43.0–77.0)
Platelets: 253 10*3/uL (ref 150.0–400.0)
RBC: 5.26 Mil/uL (ref 4.22–5.81)
RDW: 13.5 % (ref 11.5–15.5)
WBC: 9.6 10*3/uL (ref 4.0–10.5)

## 2017-05-27 MED ORDER — PANTOPRAZOLE SODIUM 40 MG PO TBEC: 40.0000 mg | DELAYED_RELEASE_TABLET | Freq: Every day | ORAL | 3 refills | Status: DC

## 2017-05-27 MED ORDER — TADALAFIL 10 MG PO TABS: ORAL_TABLET | ORAL | 0 refills | Status: DC

## 2017-05-27 NOTE — Telephone Encounter (Signed)
Pt called and stated that the pharmacy told him that if Dr. Lorin PicketScott changes his cialis rx to another medication starting with an s it will be approved today. Please advise, thank you!  Call pt @ [phone]

## 2017-05-27 NOTE — Telephone Encounter (Signed)
Patient scheduled for 11:30 today

## 2017-05-27 NOTE — Telephone Encounter (Signed)
Addressed at office visit

## 2017-05-27 NOTE — Progress Notes (Signed)
Pre-visit discussion using our clinic review tool. No additional management support is needed unless otherwise documented below in the visit note.  

## 2017-05-27 NOTE — Progress Notes (Signed)
Patient ID: Ian Pineda, male   DOB: 1985/12/01, 31 y.o.   MRN: [id number]   Subjective:    Patient ID: Ian Pineda, male    DOB: 1985/12/17, 31 y.o.   MRN: [id number]  HPI  Patient here as a work in appt with concerns regarding rectal bleeding.  He reports that 6 days ago, he went to the bathroom and noticed BRB in the toilet and on the toilet seat.  Had no further bleeding until Sunday am.  Had a bowel movement with blood.  This continued for a total of 5 days.  Had normal bowel movement yesterday and today with no blood.  No abdominal pain.  No diarrhea.  No nausea or vomiting.  Eating and drinking.  No fever.  He is also concerned regarding a persistent lesion left nares.  Raised lesion.  No bleeding.  No pain.  Also had questions regarding coverage for his cialis.     Past Medical History:  Diagnosis Date  . Back pain   . GERD (gastroesophageal reflux disease)   . Syncope    one episode worked up (cardiology and endocrinology).  felt to be related to hypoglycemia   Past Surgical History:  Procedure Laterality Date  . ANKLE SURGERY  2003  . LEG SURGERY    . WISDOM TOOTH EXTRACTION     Family History  Problem Relation Age of Onset  . Adopted: Yes   Social History   Social History  . Marital status: Married    Spouse name: Bary Limbach  . Number of children: 0  . Years of education: 14   Occupational History  . [employer]    Social History Main Topics  . Smoking status: Current Some Day Smoker    Types: Cigarettes  . Smokeless tobacco: Never Used     Comment: socially   . Alcohol use 0.0 oz/week     Comment: socially  . Drug use: No  . Sexual activity: Yes   Other Topics Concern  . None   Social History Narrative  . None    Outpatient Encounter Prescriptions as of 05/27/2017  Medication Sig  . etodolac (LODINE) 400 MG tablet Take 1 tablet (400 mg total) by mouth 2 (two) times daily as needed.  . pantoprazole (PROTONIX) 40 MG tablet Take 1 tablet (40  mg total) by mouth daily.  . tadalafil (CIALIS) 10 MG tablet Take as directed.  Do not take more often then q 72 hours prn.  . [DISCONTINUED] pantoprazole (PROTONIX) 40 MG tablet Take 1 tablet (40 mg total) by mouth daily.  . [DISCONTINUED] tadalafil (CIALIS) 10 MG tablet Take as directed.  Do not take more often then q 72 hours prn.   No facility-administered encounter medications on file as of 05/27/2017.     Review of Systems  Constitutional: Negative for appetite change and unexpected weight change.  HENT: Negative for congestion and sinus pressure.   Respiratory: Negative for cough, chest tightness and shortness of breath.   Cardiovascular: Negative for chest pain, palpitations and leg swelling.  Gastrointestinal: Negative for abdominal pain, diarrhea, nausea and vomiting.       Rectal bleeding as outlined.    Genitourinary: Negative for difficulty urinating and dysuria.  Musculoskeletal: Negative for back pain and joint swelling.  Skin: Negative for color change and rash.  Neurological: Negative for dizziness, light-headedness and headaches.  Psychiatric/Behavioral: Negative for agitation and dysphoric mood.       Objective:    Physical Exam  Constitutional: He appears well-developed and well-nourished. No distress.  HENT:  Mouth/Throat: Oropharynx is clear and moist.  Raised lesion - left nares.  No bleeding.  Non tender.   Cardiovascular: Normal rate and regular rhythm.   Pulmonary/Chest: Effort normal and breath sounds normal. No respiratory distress.  Abdominal: Soft. Bowel sounds are normal. There is no tenderness.  Genitourinary:  Genitourinary Comments: Rectal - heme positive.    Musculoskeletal: He exhibits no edema or tenderness.  Skin: No rash noted. No erythema.  Psychiatric: He has a normal mood and affect. His behavior is normal.    BP 110/60 (BP Location: Left Arm, Patient Position: Sitting, Cuff Size: Normal)   Pulse 83   Temp 98.2 F (36.8 C) (Oral)    Resp 14   Wt 279 lb 3.2 oz (126.6 kg)   SpO2 97%   BMI 38.67 kg/m  Wt Readings from Last 3 Encounters:  05/27/17 279 lb 3.2 oz (126.6 kg)  04/20/17 271 lb (122.9 kg)  03/22/17 260 lb (117.9 kg)     Lab Results  Component Value Date   WBC 9.6 05/27/2017   HGB 15.4 05/27/2017   HCT 45.0 05/27/2017   PLT 253.0 05/27/2017   GLUCOSE 114 (H) 01/13/2016   CHOL 122 01/13/2016   TRIG 92.0 01/13/2016   HDL 43.90 01/13/2016   LDLCALC 60 01/13/2016   ALT 23 01/13/2016   AST 16 01/13/2016   NA 138 01/13/2016   K 4.2 01/13/2016   CL 104 01/13/2016   CREATININE 0.86 01/13/2016   BUN 12 01/13/2016   CO2 27 01/13/2016   TSH 0.82 01/13/2016   HGBA1C 5.5 01/29/2016       Assessment & Plan:   Problem List Items Addressed This Visit    Erectile dysfunction    Has had insurance issues with his medication.  Discussed with him today.  Will start process over again with new rx.        GERD (gastroesophageal reflux disease)    Controlled on PPI.  Follow.       Relevant Medications   pantoprazole (PROTONIX) 40 MG tablet   Rectal bleeding    Rectal bleeding as outlined.  Heme positive on exam.  No gross blood now - with the last two bowel movements.  Check cbc.  Refer to GI for evaluation.        Relevant Orders   CBC with Differential/Platelet (Completed)   Ambulatory referral to Gastroenterology    Other Visit Diagnoses    Internal nasal lesion    -  Primary   Persistent.  refer to ENT.    Relevant Orders   Ambulatory referral to ENT       Dale DurhamSCOTT, Shah Insley, MD

## 2017-05-29 NOTE — Assessment & Plan Note (Signed)
Controlled on PPI.  Follow.

## 2017-05-29 NOTE — Assessment & Plan Note (Signed)
Has had insurance issues with his medication.  Discussed with him today.  Will start process over again with new rx.

## 2017-05-29 NOTE — Assessment & Plan Note (Signed)
Rectal bleeding as outlined.  Heme positive on exam.  No gross blood now - with the last two bowel movements.  Check cbc.  Refer to GI for evaluation.

## 2017-05-30 NOTE — Telephone Encounter (Signed)
Received call back from patient in regards to ENT app.let him know that the referral is in process and they will call him for ap. I have also let him know that we will call him when we have response about script. That you are out of office until Wednesday.

## 2017-05-30 NOTE — Telephone Encounter (Signed)
The medication he is inquiring about is generic for viagra.  He has tried this previously.  Thought it did not work as well.  Does he want this rx sent in.  It will be for the same dose he had previously.

## 2017-05-30 NOTE — Telephone Encounter (Signed)
Called pharmacy and confirmed that script should be for Sildenafil. Ok to try that script?

## 2017-05-31 ENCOUNTER — Telehealth: Payer: Self-pay | Admitting: Internal Medicine

## 2017-05-31 NOTE — Telephone Encounter (Signed)
I mailed out a copy of pt appt [facility] on 05/31/17.

## 2017-06-01 NOTE — Telephone Encounter (Signed)
No answer no vm

## 2017-06-01 NOTE — Telephone Encounter (Signed)
Spoke to patient he will be fine with that.

## 2017-06-02 MED ORDER — SILDENAFIL CITRATE 50 MG PO TABS: 50.0000 mg | ORAL_TABLET | Freq: Every day | ORAL | 0 refills | Status: DC | PRN

## 2017-06-02 NOTE — Telephone Encounter (Signed)
ok'd rx for generic viagra 50mg  (#10 with no refills).  Please cancel rx for cialis.

## 2017-06-02 NOTE — Telephone Encounter (Signed)
Called and left detailed message with pharmacy to c/a script for cialis.

## 2017-06-06 ENCOUNTER — Ambulatory Visit
Admission: EM | Admit: 2017-06-06 | Discharge: 2017-06-06 | Disposition: A | Discharge status: Home / Self Care | Payer: BLUE CROSS/BLUE SHIELD | Attending: Family Medicine | Admitting: Family Medicine

## 2017-06-06 ENCOUNTER — Encounter: Payer: Self-pay | Admitting: Emergency Medicine

## 2017-06-06 DIAGNOSIS — J069 Acute upper respiratory infection, unspecified: Secondary | ICD-10-CM

## 2017-06-06 DIAGNOSIS — R05 Cough: Secondary | ICD-10-CM | POA: Diagnosis not present

## 2017-06-06 DIAGNOSIS — B9789 Other viral agents as the cause of diseases classified elsewhere: Secondary | ICD-10-CM

## 2017-06-06 DIAGNOSIS — R062 Wheezing: Secondary | ICD-10-CM | POA: Diagnosis not present

## 2017-06-06 MED ORDER — ALBUTEROL SULFATE HFA 108 (90 BASE) MCG/ACT IN AERS: 1.0000 | INHALATION_SPRAY | Freq: Four times a day (QID) | RESPIRATORY_TRACT | 0 refills | Status: DC | PRN

## 2017-06-06 MED ORDER — PREDNISONE 20 MG PO TABS: 20.0000 mg | ORAL_TABLET | Freq: Every day | ORAL | 0 refills | Status: DC

## 2017-06-06 MED ORDER — HYDROCOD POLST-CPM POLST ER 10-8 MG/5ML PO SUER: 5.0000 mL | Freq: Every evening | ORAL | 0 refills | Status: DC | PRN

## 2017-06-06 MED ORDER — BENZONATATE 100 MG PO CAPS: 100.0000 mg | ORAL_CAPSULE | Freq: Three times a day (TID) | ORAL | 0 refills | Status: DC | PRN

## 2017-06-06 NOTE — ED Provider Notes (Signed)
[facility]    CSN: [id number] Arrival date & time: 06/06/17  1551     History   Chief Complaint Chief Complaint  Patient presents with  . Cough  . Nasal Congestion    HPI Ian Pineda is a 31 y.o. male.   The history is provided by the patient.  Cough  Associated symptoms: rhinorrhea and wheezing   URI  Presenting symptoms: congestion, cough and rhinorrhea   Severity:  Moderate Onset quality:  Sudden Duration:  3 days Timing:  Constant Progression:  Worsening Chronicity:  New Relieved by:  Nothing Associated symptoms: wheezing   Risk factors: sick contacts   Risk factors: not elderly, no chronic cardiac disease, no chronic kidney disease, no chronic respiratory disease, no diabetes mellitus, no immunosuppression, no recent illness and no recent travel     Past Medical History:  Diagnosis Date  . Back pain   . GERD (gastroesophageal reflux disease)   . Syncope    one episode worked up (cardiology and endocrinology).  felt to be related to hypoglycemia    Patient Active Problem List   Diagnosis Date Noted  . Rectal bleeding 05/27/2017  . GERD (gastroesophageal reflux disease) 07/02/2016  . Nasal congestion 07/02/2016  . Skin lesion 02/01/2016  . Left elbow pain 10/05/2015  . Health care maintenance 10/05/2015  . Ganglion of right hand 03/17/2015  . Back pain 10/29/2014  . Stress 10/29/2014  . Chest pain 10/29/2014  . Low testosterone 10/24/2014  . Erectile dysfunction 12/21/2013    Past Surgical History:  Procedure Laterality Date  . ANKLE SURGERY  2003  . LEG SURGERY    . WISDOM TOOTH EXTRACTION         Home Medications    Prior to Admission medications   Medication Sig Start Date End Date Taking? Authorizing Provider  albuterol (PROVENTIL HFA;VENTOLIN HFA) 108 (90 Base) MCG/ACT inhaler Inhale 1-2 puffs into the lungs every 6 (six) hours as needed for wheezing or shortness of breath. 06/06/17   Payton Mccallum, MD  benzonatate  (TESSALON) 100 MG capsule Take 1 capsule (100 mg total) by mouth 3 (three) times daily as needed. 06/06/17   Payton Mccallum, MD  chlorpheniramine-HYDROcodone (TUSSIONEX PENNKINETIC ER) 10-8 MG/5ML SUER Take 5 mLs by mouth at bedtime as needed. 06/06/17   Payton Mccallum, MD  pantoprazole (PROTONIX) 40 MG tablet Take 1 tablet (40 mg total) by mouth daily. 05/27/17   Dale , MD  predniSONE (DELTASONE) 20 MG tablet Take 1 tablet (20 mg total) by mouth daily. 06/06/17   Payton Mccallum, MD  sildenafil (VIAGRA) 50 MG tablet Take 1 tablet (50 mg total) by mouth daily as needed for erectile dysfunction. 06/02/17   Dale , MD    Family History Family History  Problem Relation Age of Onset  . Adopted: Yes    Social History Social History  Substance Use Topics  . Smoking status: Current Some Day Smoker    Types: Cigarettes  . Smokeless tobacco: Never Used     Comment: socially   . Alcohol use 0.0 oz/week     Comment: socially     Allergies   Patient has no known allergies.   Review of Systems Review of Systems  HENT: Positive for congestion and rhinorrhea.   Respiratory: Positive for cough and wheezing.      Physical Exam Triage Vital Signs ED Triage Vitals  Enc Vitals Group     BP 06/06/17 1605 134/74     Pulse Rate 06/06/17  1605 82     Resp 06/06/17 1605 16     Temp 06/06/17 1605 98.5 F (36.9 C)     Temp Source 06/06/17 1605 Oral     SpO2 06/06/17 1605 100 %     Weight 06/06/17 1603 265 lb (120.2 kg)     Height 06/06/17 1603 6' (1.829 m)     Head Circumference --      Peak Flow --      Pain Score 06/06/17 1603 4     Pain Loc --      Pain Edu? --      Excl. in GC? --    No data found.   Updated Vital Signs BP 134/74 (BP Location: Left Arm)   Pulse 82   Temp 98.5 F (36.9 C) (Oral)   Resp 16   Ht 6' (1.829 m)   Wt 265 lb (120.2 kg)   SpO2 100%   BMI 35.94 kg/m   Visual Acuity Right Eye Distance:   Left Eye Distance:   Bilateral Distance:     Right Eye Near:   Left Eye Near:    Bilateral Near:     Physical Exam  Constitutional: He appears well-developed and well-nourished. No distress.  HENT:  Head: Normocephalic and atraumatic.  Right Ear: Tympanic membrane, external ear and ear canal normal.  Left Ear: Tympanic membrane, external ear and ear canal normal.  Nose: Nose normal.  Mouth/Throat: Uvula is midline, oropharynx is clear and moist and mucous membranes are normal. No oropharyngeal exudate or tonsillar abscesses.  Eyes: Pupils are equal, round, and reactive to light. Conjunctivae and EOM are normal. Right eye exhibits no discharge. Left eye exhibits no discharge. No scleral icterus.  Neck: Normal range of motion. Neck supple. No tracheal deviation present. No thyromegaly present.  Cardiovascular: Normal rate, regular rhythm and normal heart sounds.   Pulmonary/Chest: Effort normal. No stridor. No respiratory distress. He has wheezes (diffuse expiratory). He has no rales. He exhibits no tenderness.  Lymphadenopathy:    He has no cervical adenopathy.  Neurological: He is alert.  Skin: Skin is warm and dry. No rash noted. He is not diaphoretic.  Nursing note and vitals reviewed.    UC Treatments / Results  Labs (all labs ordered are listed, but only abnormal results are displayed) Labs Reviewed - No data to display  EKG  EKG Interpretation None       Radiology No results found.  Procedures Procedures (including critical care time)  Medications Ordered in UC Medications - No data to display   Initial Impression / Assessment and Plan / UC Course  I have reviewed the triage vital signs and the nursing notes.  Pertinent labs & imaging results that were available during my care of the patient were reviewed by me and considered in my medical decision making (see chart for details).       Final Clinical Impressions(s) / UC Diagnoses   Final diagnoses:  Viral URI with cough  Wheezing    New  Prescriptions Discharge Medication List as of 06/06/2017  4:32 PM    START taking these medications   Details  albuterol (PROVENTIL HFA;VENTOLIN HFA) 108 (90 Base) MCG/ACT inhaler Inhale 1-2 puffs into the lungs every 6 (six) hours as needed for wheezing or shortness of breath., Starting Mon 06/06/2017, Normal    benzonatate (TESSALON) 100 MG capsule Take 1 capsule (100 mg total) by mouth 3 (three) times daily as needed., Starting Mon 06/06/2017, Normal  chlorpheniramine-HYDROcodone (TUSSIONEX PENNKINETIC ER) 10-8 MG/5ML SUER Take 5 mLs by mouth at bedtime as needed., Starting Mon 06/06/2017, Normal    predniSONE (DELTASONE) 20 MG tablet Take 1 tablet (20 mg total) by mouth daily., Starting Mon 06/06/2017, Normal       1. diagnosis reviewed with patient 2. rx as per orders above; reviewed possible side effects, interactions, risks and benefits   3. Follow-up prn if symptoms worsen or don't improve   Payton Mccallumonty, Kalem Rockwell, MD 06/06/17 2112

## 2017-06-06 NOTE — ED Triage Notes (Signed)
Patient c/o runny nose, cough and chest congestion for 2 days.  Patient denies fevers.

## 2017-06-14 ENCOUNTER — Telehealth: Payer: Self-pay | Admitting: Internal Medicine

## 2017-06-14 DIAGNOSIS — T162XXA Foreign body in left ear, initial encounter: Secondary | ICD-10-CM | POA: Diagnosis not present

## 2017-06-14 DIAGNOSIS — D140 Benign neoplasm of middle ear, nasal cavity and accessory sinuses: Secondary | ICD-10-CM | POA: Diagnosis not present

## 2017-06-14 NOTE — Telephone Encounter (Signed)
Pt dropped off 2-FMLA papers to be filled out one for him and one for when he has to take his wife to appt. Placed in Dr. Lorin PicketScott colored folder upfront. When completed please fax to [phone]

## 2017-06-16 DIAGNOSIS — Z0279 Encounter for issue of other medical certificate: Secondary | ICD-10-CM

## 2017-06-16 NOTE — Telephone Encounter (Signed)
Filled out form from last ones done. Sticky note put where your signature is needed on both. Their is only one charge sheet for both forms do we need to to another one because there are two sets?   In your red folder

## 2017-06-17 NOTE — Telephone Encounter (Signed)
Called patient l/m to call office. Have copy to send to charge and originals in hold folder for patient to call back. Need to let him know that he will be charged for each set of PPW. Also want to know if he wants to pick up or fax and mail copy for his records?

## 2017-06-17 NOTE — Telephone Encounter (Signed)
Please notify pt that FMLA forms are completed.  There will be a charge for both forms.

## 2017-06-22 NOTE — Telephone Encounter (Signed)
Called l/m to let him know that I was faxing and to call if he wanted copy of forms

## 2017-06-23 ENCOUNTER — Telehealth: Payer: Self-pay | Admitting: *Deleted

## 2017-06-23 NOTE — Telephone Encounter (Addendum)
Pt will need a prior authorization for pantoprazole and Cialis   Pharmacy: Pt will call back with the correct mail order pharmacy  Pt contact [phone]

## 2017-06-27 ENCOUNTER — Telehealth: Payer: Self-pay | Admitting: Internal Medicine

## 2017-06-27 NOTE — Telephone Encounter (Signed)
Pt called about his medication needing to be a mail order for all medications. [facility]. Thank you!

## 2017-07-05 ENCOUNTER — Telehealth: Payer: Self-pay | Admitting: *Deleted

## 2017-07-05 NOTE — Telephone Encounter (Signed)
Patient requested a medication refill for etodolac  Pharmacy [facility]

## 2017-07-05 NOTE — Telephone Encounter (Signed)
Last given 09/30/16  Last o/v 05/27/17  No follow up

## 2017-07-06 MED ORDER — ETODOLAC 400 MG PO TABS: 400.0000 mg | ORAL_TABLET | Freq: Two times a day (BID) | ORAL | 0 refills | Status: DC | PRN

## 2017-07-06 NOTE — Telephone Encounter (Signed)
I sent in rx for etodolac.  The pharmacy that was pulled up was [facility].  Not sure if this is the correct pharmacy.  If needs at a different pharmacy, will need to cancel this rx and send in a new rx.  Also needs to make sure has a f/u appt scheduled over the next few months.

## 2017-07-07 NOTE — Telephone Encounter (Signed)
Noted, waiting for pharmacy information. thanks

## 2017-07-07 NOTE — Telephone Encounter (Signed)
Added [facility] to the chart, thanks

## 2017-07-17 ENCOUNTER — Other Ambulatory Visit: Payer: Self-pay | Admitting: Internal Medicine

## 2017-07-19 NOTE — Telephone Encounter (Signed)
Last OV was 05/27/17, last refill was, 07/06/17, #30 with no refill.  Please advise, thanks

## 2017-07-19 NOTE — Telephone Encounter (Signed)
If just refilled 07/06/17., pt should not need refill.  Rarely uses.  If requiring increased amount - need to know.  I don't want him taking an increased amount secondary to his stomach issues.

## 2017-07-19 NOTE — Telephone Encounter (Signed)
Left a VM to return my call. thanks 

## 2017-08-04 NOTE — Telephone Encounter (Signed)
Patient states he did not need refill it was sent by pharmacy for etodolac. He would like script for Protonix and viagra [facility]

## 2017-08-05 MED ORDER — SILDENAFIL CITRATE 50 MG PO TABS
50.0000 mg | ORAL_TABLET | Freq: Every day | ORAL | 0 refills | Status: DC | PRN
Start: 2017-08-05 — End: 2017-12-22

## 2017-08-05 MED ORDER — PANTOPRAZOLE SODIUM 40 MG PO TBEC: 40.0000 mg | DELAYED_RELEASE_TABLET | Freq: Every day | ORAL | 1 refills | Status: DC

## 2017-08-05 NOTE — Telephone Encounter (Signed)
L/m to let pt know script requested called in.

## 2017-08-05 NOTE — Telephone Encounter (Signed)
rx sent in for protonix #90 with 1 refill and viagra refill to [facility].

## 2017-12-22 ENCOUNTER — Other Ambulatory Visit: Payer: Self-pay | Admitting: *Deleted

## 2017-12-22 ENCOUNTER — Other Ambulatory Visit: Payer: Self-pay | Admitting: Internal Medicine

## 2017-12-22 MED ORDER — SILDENAFIL CITRATE 50 MG PO TABS: 50.0000 mg | ORAL_TABLET | Freq: Every day | ORAL | 0 refills | Status: DC | PRN

## 2017-12-22 NOTE — Telephone Encounter (Signed)
Copied from CRM [id number]. Topic: Quick Communication - Rx Refill/Question >> Dec 22, 2017  3:57 PM Ian Pineda, Ian Pineda wrote: Medication: sildenafil (VIAGRA) 50 MG tablet Has the patient contacted their pharmacy? No, needs a new rx sent to mail order (Agent: If no, request that the patient contact the pharmacy for the refill.) Preferred Pharmacy (with phone number or street name): [facility] Agent: Please be advised that RX refills may take up to 3 business days. We ask that you follow-up with your pharmacy.

## 2018-02-08 ENCOUNTER — Ambulatory Visit
Admission: EM | Admit: 2018-02-08 | Discharge: 2018-02-08 | Disposition: A | Discharge status: Home / Self Care | Payer: BLUE CROSS/BLUE SHIELD | Attending: Internal Medicine | Admitting: Internal Medicine

## 2018-02-08 DIAGNOSIS — K529 Noninfective gastroenteritis and colitis, unspecified: Secondary | ICD-10-CM | POA: Diagnosis not present

## 2018-02-08 MED ORDER — ONDANSETRON HCL 4 MG PO TABS: 8.0000 mg | ORAL_TABLET | ORAL | 0 refills | Status: DC | PRN

## 2018-02-08 MED ORDER — ONDANSETRON 8 MG PO TBDP
8.0000 mg | ORAL_TABLET | Freq: Once | ORAL | Status: AC
  Administered 2018-02-08: 8 mg via ORAL

## 2018-02-08 NOTE — Discharge Instructions (Addendum)
Anticipate gradual improvement in stomach upset, diarrhea, fever, over the next few days.  Push fluids and rest.  Prescription for ondansetron, for nausea, was sent to the pharmacy.  Stool frequency and consistency may take a couple weeks to return to normal.  Recheck for persistent (>3-4 more days) fever greater than 100.5, severe/sustained (>1 hr) abdominal pain, bloody or black stool or vomitus, or if just not improving in a few days.  Wash hands carefully after trips to the bathroom to avoid spreading infection to others.

## 2018-02-08 NOTE — ED Triage Notes (Signed)
Pt complains of fever since last night, body aches, diarrhea x4 today, cough. Said he did take tylenol that helped fever but came back. Does have a pregnant wife. Did get his flu shot.

## 2018-02-11 NOTE — ED Provider Notes (Signed)
[facility]    CSN: [id number] Arrival date & time: 02/08/18  1543     History   Chief Complaint Chief Complaint  Patient presents with  . Fever    HPI Ian Pineda is a 32 y.o. male. He presents with onset of tactile temp, malaise, achiness, cough last night. Maybe a little bit of sore throat, runny/congested nose. Diarrhea x 4. Nausea, no vomiting. Had a flu shot.  Wife is 6 months pregnant.   HPI  Past Medical History:  Diagnosis Date  . Back pain   . GERD (gastroesophageal reflux disease)   . Syncope    one episode worked up (cardiology and endocrinology).  felt to be related to hypoglycemia    Patient Active Problem List   Diagnosis Date Noted  . Rectal bleeding 05/27/2017  . GERD (gastroesophageal reflux disease) 07/02/2016  . Nasal congestion 07/02/2016  . Skin lesion 02/01/2016  . Left elbow pain 10/05/2015  . Health care maintenance 10/05/2015  . Ganglion of right hand 03/17/2015  . Back pain 10/29/2014  . Stress 10/29/2014  . Chest pain 10/29/2014  . Low testosterone 10/24/2014  . Erectile dysfunction 12/21/2013    Past Surgical History:  Procedure Laterality Date  . ANKLE SURGERY  2003  . LEG SURGERY    . WISDOM TOOTH EXTRACTION         Home Medications    Prior to Admission medications   Medication Sig Start Date End Date Taking? Authorizing Provider  albuterol (PROVENTIL HFA;VENTOLIN HFA) 108 (90 Base) MCG/ACT inhaler Inhale 1-2 puffs into the lungs every 6 (six) hours as needed for wheezing or shortness of breath. 06/06/17   Payton Mccallum, MD  benzonatate (TESSALON) 100 MG capsule Take 1 capsule (100 mg total) by mouth 3 (three) times daily as needed. 06/06/17   Payton Mccallum, MD  chlorpheniramine-HYDROcodone (TUSSIONEX PENNKINETIC ER) 10-8 MG/5ML SUER Take 5 mLs by mouth at bedtime as needed. 06/06/17   Payton Mccallum, MD  etodolac (LODINE) 400 MG tablet Take 1 tablet (400 mg total) by mouth 2 (two) times daily as needed.  07/06/17   Dale Depew, MD  ondansetron (ZOFRAN) 4 MG tablet Take 2 tablets (8 mg total) by mouth every 4 (four) hours as needed for nausea or vomiting. 02/08/18   Isa Rankin, MD  pantoprazole (PROTONIX) 40 MG tablet Take 1 tablet (40 mg total) by mouth daily. 08/05/17   Dale Ellenboro, MD  predniSONE (DELTASONE) 20 MG tablet Take 1 tablet (20 mg total) by mouth daily. 06/06/17   Payton Mccallum, MD  sildenafil (VIAGRA) 50 MG tablet Take 1 tablet (50 mg total) by mouth daily as needed for erectile dysfunction. 12/22/17   Dale Stow, MD    Family History Family History  Adopted: Yes    Social History Social History   Tobacco Use  . Smoking status: Current Some Day Smoker    Types: Cigarettes  . Smokeless tobacco: Never Used  . Tobacco comment: socially   Substance Use Topics  . Alcohol use: Yes    Alcohol/week: 0.0 oz    Comment: socially  . Drug use: No     Allergies   Patient has no known allergies.   Review of Systems Review of Systems  All other systems reviewed and are negative.    Physical Exam Triage Vital Signs ED Triage Vitals  Enc Vitals Group     BP 02/08/18 1553 140/83     Pulse Rate 02/08/18 1553 (!) 115  Resp 02/08/18 1553 18     Temp 02/08/18 1553 99.3 F (37.4 C)     Temp Source 02/08/18 1553 Oral     SpO2 02/08/18 1553 98 %     Weight 02/08/18 1555 275 lb (124.7 kg)     Height --      Pain Score 02/08/18 1555 7     Pain Loc --    Updated Vital Signs BP 140/83 (BP Location: Left Arm)   Pulse (!) 115   Temp 99.3 F (37.4 C) (Oral)   Resp 18   Wt 275 lb (124.7 kg)   SpO2 98%   BMI 37.30 kg/m  Physical Exam  Constitutional: He is oriented to person, place, and time.  Alert, nicely groomed Looks ill but not toxic Sitting up in bedside chair  HENT:  Head: Atraumatic.  B TMs mildly dull, no erythema Mild nasal congestion with clear mucusy material present Throat slightly injected  Eyes:  Conjugate gaze, mildly  injected conunctivae bilaterally, no eye drainage  Neck: Neck supple.  Cardiovascular: Regular rhythm.  HR 110s on exam  Pulmonary/Chest: No respiratory distress. He has no wheezes. He has no rales.  Lungs clear, symmetric breath sounds   Abdominal: Soft. He exhibits no distension. There is no tenderness. There is no rebound and no guarding.  Musculoskeletal: Normal range of motion.  Neurological: He is alert and oriented to person, place, and time.  Skin: Skin is warm and dry.  No cyanosis  Nursing note and vitals reviewed.    UC Treatments / Results   Procedures Procedures (including critical care time)  Medications Ordered in UC Medications  ondansetron (ZOFRAN-ODT) disintegrating tablet 8 mg (8 mg Oral Given 02/08/18 1624)    Final Clinical Impressions(s) / UC Diagnoses   Final diagnoses:  Gastroenteritis   Anticipate gradual improvement in stomach upset, diarrhea, fever, over the next few days.  Push fluids and rest.  Prescription for ondansetron, for nausea, was sent to the pharmacy.  Stool frequency and consistency may take a couple weeks to return to normal.  Recheck for persistent (>3-4 more days) fever greater than 100.5, severe/sustained (>1 hr) abdominal pain, bloody or black stool or vomitus, or if just not improving in a few days.  Wash hands carefully after trips to the bathroom to avoid spreading infection to others.    ED Discharge Orders        Ordered    ondansetron (ZOFRAN) 4 MG tablet  Every 4 hours PRN     02/08/18 1633        Isa Rankin, MD 02/11/18 1110

## 2018-07-10 ENCOUNTER — Encounter: Payer: Self-pay | Admitting: Emergency Medicine

## 2018-07-10 ENCOUNTER — Other Ambulatory Visit: Payer: Self-pay

## 2018-07-10 ENCOUNTER — Ambulatory Visit
Admission: EM | Admit: 2018-07-10 | Discharge: 2018-07-10 | Disposition: A | Discharge status: Home / Self Care | Payer: BLUE CROSS/BLUE SHIELD | Attending: Family Medicine | Admitting: Family Medicine

## 2018-07-10 DIAGNOSIS — B9789 Other viral agents as the cause of diseases classified elsewhere: Secondary | ICD-10-CM

## 2018-07-10 DIAGNOSIS — J069 Acute upper respiratory infection, unspecified: Secondary | ICD-10-CM

## 2018-07-10 LAB — RAPID STREP SCREEN (MED CTR MEBANE ONLY): Streptococcus, Group A Screen (Direct): NEGATIVE

## 2018-07-10 MED ORDER — HYDROCOD POLST-CPM POLST ER 10-8 MG/5ML PO SUER: 5.0000 mL | Freq: Every evening | ORAL | 0 refills | Status: DC | PRN

## 2018-07-10 NOTE — ED Provider Notes (Signed)
[facility]    CSN: [id number] Arrival date & time: 07/10/18  1003     History   Chief Complaint Chief Complaint  Patient presents with  . Sore Throat    HPI Ian Pineda is a 32 y.o. male.   The history is provided by the patient.  Sore Throat  Pertinent negatives include no headaches.  URI  Presenting symptoms: congestion, cough, rhinorrhea and sore throat   Severity:  Moderate Onset quality:  Sudden Duration:  3 days Timing:  Constant Progression:  Worsening Chronicity:  New Relieved by:  Nothing Ineffective treatments:  OTC medications Associated symptoms: no headaches, no myalgias, no sinus pain and no wheezing   Risk factors: sick contacts   Risk factors: not elderly, no chronic cardiac disease, no chronic kidney disease, no chronic respiratory disease, no diabetes mellitus, no immunosuppression, no recent illness and no recent travel     Past Medical History:  Diagnosis Date  . Back pain   . GERD (gastroesophageal reflux disease)   . Syncope    one episode worked up (cardiology and endocrinology).  felt to be related to hypoglycemia    Patient Active Problem List   Diagnosis Date Noted  . Rectal bleeding 05/27/2017  . GERD (gastroesophageal reflux disease) 07/02/2016  . Nasal congestion 07/02/2016  . Skin lesion 02/01/2016  . Left elbow pain 10/05/2015  . Health care maintenance 10/05/2015  . Ganglion of right hand 03/17/2015  . Back pain 10/29/2014  . Stress 10/29/2014  . Chest pain 10/29/2014  . Low testosterone 10/24/2014  . Erectile dysfunction 12/21/2013    Past Surgical History:  Procedure Laterality Date  . ANKLE SURGERY  2003  . LEG SURGERY    . WISDOM TOOTH EXTRACTION         Home Medications    Prior to Admission medications   Medication Sig Start Date End Date Taking? Authorizing Provider  etodolac (LODINE) 400 MG tablet Take 1 tablet (400 mg total) by mouth 2 (two) times daily as needed. 07/06/17  Yes Dale Franklin, MD  pantoprazole (PROTONIX) 40 MG tablet Take 1 tablet (40 mg total) by mouth daily. 08/05/17  Yes Dale Dawson, MD  albuterol (PROVENTIL HFA;VENTOLIN HFA) 108 (90 Base) MCG/ACT inhaler Inhale 1-2 puffs into the lungs every 6 (six) hours as needed for wheezing or shortness of breath. 06/06/17   Payton Mccallum, MD  benzonatate (TESSALON) 100 MG capsule Take 1 capsule (100 mg total) by mouth 3 (three) times daily as needed. 06/06/17   Payton Mccallum, MD  chlorpheniramine-HYDROcodone (TUSSIONEX PENNKINETIC ER) 10-8 MG/5ML SUER Take 5 mLs by mouth at bedtime as needed. 07/10/18   Payton Mccallum, MD  ondansetron (ZOFRAN) 4 MG tablet Take 2 tablets (8 mg total) by mouth every 4 (four) hours as needed for nausea or vomiting. 02/08/18   Isa Rankin, MD  predniSONE (DELTASONE) 20 MG tablet Take 1 tablet (20 mg total) by mouth daily. 06/06/17   Payton Mccallum, MD  sildenafil (VIAGRA) 50 MG tablet Take 1 tablet (50 mg total) by mouth daily as needed for erectile dysfunction. 12/22/17   Dale Methow, MD    Family History Family History  Adopted: Yes    Social History Social History   Tobacco Use  . Smoking status: Former Smoker    Types: Cigarettes  . Smokeless tobacco: Never Used  . Tobacco comment: socially   Substance Use Topics  . Alcohol use: Yes    Alcohol/week: 0.0 standard drinks  Comment: socially  . Drug use: No     Allergies   Patient has no known allergies.   Review of Systems Review of Systems  HENT: Positive for congestion, rhinorrhea and sore throat. Negative for sinus pain.   Respiratory: Positive for cough. Negative for wheezing.   Musculoskeletal: Negative for myalgias.  Neurological: Negative for headaches.     Physical Exam Triage Vital Signs ED Triage Vitals  Enc Vitals Group     BP 07/10/18 1014 134/82     Pulse Rate 07/10/18 1014 98     Resp 07/10/18 1014 16     Temp 07/10/18 1014 98.6 F (37 C)     Temp Source 07/10/18 1014 Oral      SpO2 07/10/18 1014 98 %     Weight 07/10/18 1010 275 lb (124.7 kg)     Height 07/10/18 1010 6' (1.829 m)     Head Circumference --      Peak Flow --      Pain Score 07/10/18 1010 6     Pain Loc --      Pain Edu? --      Excl. in GC? --    No data found.  Updated Vital Signs BP 134/82 (BP Location: Left Arm)   Pulse 98   Temp 98.6 F (37 C) (Oral)   Resp 16   Ht 6' (1.829 m)   Wt 124.7 kg   SpO2 98%   BMI 37.30 kg/m   Visual Acuity Right Eye Distance:   Left Eye Distance:   Bilateral Distance:    Right Eye Near:   Left Eye Near:    Bilateral Near:     Physical Exam  Constitutional: He appears well-developed and well-nourished. No distress.  HENT:  Head: Normocephalic and atraumatic.  Right Ear: Tympanic membrane, external ear and ear canal normal.  Left Ear: Tympanic membrane, external ear and ear canal normal.  Nose: Nose normal.  Mouth/Throat: Uvula is midline, oropharynx is clear and moist and mucous membranes are normal. No oropharyngeal exudate or tonsillar abscesses.  Neck: Normal range of motion. Neck supple. No tracheal deviation present. No thyromegaly present.  Cardiovascular: Normal rate, regular rhythm and normal heart sounds.  Pulmonary/Chest: Effort normal and breath sounds normal. No stridor. No respiratory distress. He has no wheezes. He has no rales. He exhibits no tenderness.  Lymphadenopathy:    He has no cervical adenopathy.  Neurological: He is alert.  Skin: Skin is warm and dry. No rash noted. He is not diaphoretic.  Nursing note and vitals reviewed.    UC Treatments / Results  Labs (all labs ordered are listed, but only abnormal results are displayed) Labs Reviewed  RAPID STREP SCREEN ([facility] ONLY)  CULTURE, GROUP A STREP [facility])    EKG None  Radiology No results found.  Procedures Procedures (including critical care time)  Medications Ordered in UC Medications - No data to display  Initial Impression / Assessment  and Plan / UC Course  I have reviewed the triage vital signs and the nursing notes.  Pertinent labs & imaging results that were available during my care of the patient were reviewed by me and considered in my medical decision making (see chart for details).      Final Clinical Impressions(s) / UC Diagnoses   Final diagnoses:  Viral URI with cough   Discharge Instructions   None    ED Prescriptions    Medication Sig Dispense Auth. Provider   chlorpheniramine-HYDROcodone (TUSSIONEX  PENNKINETIC ER) 10-8 MG/5ML SUER Take 5 mLs by mouth at bedtime as needed. 60 mL Payton Mccallumonty, Ladell Lea, MD     1. Lab results and diagnosis reviewed with patient 2. rx as per orders above; reviewed possible side effects, interactions, risks and benefits  3. Recommend supportive treatment with rest, fluids 4. Follow-up prn if symptoms worsen or don't improve  Controlled Substance Prescriptions [place] Controlled Substance Registry consulted? Not Applicable   Payton Mccallumonty, Kynadee Dam, MD 07/10/18 1058

## 2018-07-10 NOTE — ED Triage Notes (Signed)
Patient c/o sore throat, runny nose, and cough for the past 3 days. Patient denies fevers.

## 2018-07-13 ENCOUNTER — Telehealth (HOSPITAL_COMMUNITY): Payer: Self-pay

## 2018-07-13 LAB — CULTURE, GROUP A STREP (THRC)

## 2018-07-13 NOTE — Telephone Encounter (Signed)
Culture is positive for non group A Strep germ.  This is a finding of uncertain significance; not the typical 'strep throat' germ.  Attempted to reach patient. No answer at this time.  

## 2018-07-20 ENCOUNTER — Telehealth: Payer: Self-pay | Admitting: Internal Medicine

## 2018-07-20 DIAGNOSIS — Z0279 Encounter for issue of other medical certificate: Secondary | ICD-10-CM

## 2018-07-20 NOTE — Telephone Encounter (Signed)
PT dropped off FMLA papers to be complete. Papers are up front in color folder.

## 2018-07-24 NOTE — Telephone Encounter (Signed)
FMLA papers completed and placed in quick sign

## 2018-07-25 NOTE — Telephone Encounter (Signed)
I have signed his papers.  Regarding his wife's form, I have not seen her recently.  Is she still having intermittent flares (that he will need to be off) - I have no documentaion of this recently.

## 2018-07-26 NOTE — Telephone Encounter (Signed)
Please call and notify pt that I do not mind doing his FMLA form - his is done.  We did an FLMA for him last year for his wife because she was having intermittent episode of GI issues (seen in ER, etc).  She apparently is doing better regarding this.  She will need intermittent f/u for her blood pressure, but is apparently not having other acute issues.  I have completed his FMLA which will cover him for his appts and time out, etc.

## 2018-07-26 NOTE — Telephone Encounter (Signed)
Patients wife was seen on 8/23 by Lauren for HTN. According to husband, she is still having intermittent flares.

## 2018-07-26 NOTE — Telephone Encounter (Signed)
See last unrouted msg 

## 2018-07-27 NOTE — Telephone Encounter (Signed)
Noted.  See attached.  We are just sending (or him picking up[facility]  Not his wife.

## 2018-07-27 NOTE — Telephone Encounter (Signed)
Patient voiced understanding and said that was fine to please fax his FMLA , advised patient we do not mind faxing but the original needs to be picked up due to faxes sometimes do not go through and he needs to make sure his employer has copy.

## 2018-07-31 NOTE — Telephone Encounter (Signed)
Spoke with patient to let him know that we could not complete wifes paperwork but his was ready. Will fax to number listed and leave original up front for pick up

## 2018-09-08 ENCOUNTER — Ambulatory Visit
Admission: EM | Admit: 2018-09-08 | Discharge: 2018-09-08 | Disposition: A | Discharge status: Home / Self Care | Payer: BLUE CROSS/BLUE SHIELD | Attending: Family Medicine | Admitting: Family Medicine

## 2018-09-08 DIAGNOSIS — J111 Influenza due to unidentified influenza virus with other respiratory manifestations: Secondary | ICD-10-CM | POA: Diagnosis not present

## 2018-09-08 LAB — RAPID INFLUENZA A&B ANTIGENS
Influenza A (ARMC): POSITIVE — AB
Influenza B (ARMC): NEGATIVE

## 2018-09-08 MED ORDER — BENZONATATE 200 MG PO CAPS: ORAL_CAPSULE | ORAL | 0 refills | Status: DC

## 2018-09-08 MED ORDER — OSELTAMIVIR PHOSPHATE 75 MG PO CAPS: 75.0000 mg | ORAL_CAPSULE | Freq: Two times a day (BID) | ORAL | 0 refills | Status: DC

## 2018-09-08 NOTE — ED Triage Notes (Signed)
Pt here for body aches, feeling bad, fever since 4pm. Did take ibuprofen at 4:30 and fever has went down some and has been having hot/cold chills. Concerned for the flu due to 45 month old kid

## 2018-09-08 NOTE — Discharge Instructions (Signed)
Rest as much as possible.  Drink plenty of fluids.  Use Tylenol or Motrin for fever or body aches.  Do not return to work until your fever has broken for 24 hours.  Wash hands frequently and wear a mask when around your child wife

## 2018-09-08 NOTE — ED Provider Notes (Signed)
[facility]    CSN: [id number] Arrival date & time: 09/08/18  1800     History   Chief Complaint Chief Complaint  Patient presents with  . Generalized Body Aches    HPI Ian Pineda is a 32 y.o. male.   HPI  32 year old male presents with the sudden onset today of body aches fever and terrible fatigue.  He states that it is all started about 4:00 this afternoon.  Took ibuprofen at 430 when he found his temperature was 100.7.  At home from work he laid down in bed and had shaking chills so he took the ibuprofen.  Is a 11-month-old infant at home and his wife was very concerned that her husband had flu and recommended he come here.  He has a temperature of 99.6 pulse rate of 135 blood pressure 149/91 and O2 sats of 99% on room air         Past Medical History:  Diagnosis Date  . Back pain   . GERD (gastroesophageal reflux disease)   . Syncope    one episode worked up (cardiology and endocrinology).  felt to be related to hypoglycemia    Patient Active Problem List   Diagnosis Date Noted  . Rectal bleeding 05/27/2017  . GERD (gastroesophageal reflux disease) 07/02/2016  . Nasal congestion 07/02/2016  . Skin lesion 02/01/2016  . Left elbow pain 10/05/2015  . Health care maintenance 10/05/2015  . Ganglion of right hand 03/17/2015  . Back pain 10/29/2014  . Stress 10/29/2014  . Chest pain 10/29/2014  . Low testosterone 10/24/2014  . Erectile dysfunction 12/21/2013    Past Surgical History:  Procedure Laterality Date  . ANKLE SURGERY  2003  . LEG SURGERY    . WISDOM TOOTH EXTRACTION         Home Medications    Prior to Admission medications   Medication Sig Start Date End Date Taking? Authorizing Provider  esomeprazole (NEXIUM) 20 MG capsule Take 20 mg by mouth daily at 12 noon.   Yes [provider]  etodolac (LODINE) 400 MG tablet Take 1 tablet (400 mg total) by mouth 2 (two) times daily as needed. 07/06/17  Yes Dale Roy, MD    sildenafil (VIAGRA) 50 MG tablet Take 1 tablet (50 mg total) by mouth daily as needed for erectile dysfunction. 12/22/17  Yes Dale Como, MD  albuterol (PROVENTIL HFA;VENTOLIN HFA) 108 (90 Base) MCG/ACT inhaler Inhale 1-2 puffs into the lungs every 6 (six) hours as needed for wheezing or shortness of breath. 06/06/17   Payton Mccallum, MD  benzonatate (TESSALON) 200 MG capsule Take one cap TID PRN cough 09/08/18   Lutricia Feil, PA-C  chlorpheniramine-HYDROcodone (TUSSIONEX PENNKINETIC ER) 10-8 MG/5ML SUER Take 5 mLs by mouth at bedtime as needed. 07/10/18   Payton Mccallum, MD  ondansetron (ZOFRAN) 4 MG tablet Take 2 tablets (8 mg total) by mouth every 4 (four) hours as needed for nausea or vomiting. 02/08/18   Isa Rankin, MD  oseltamivir (TAMIFLU) 75 MG capsule Take 1 capsule (75 mg total) by mouth every 12 (twelve) hours. 09/08/18   Lutricia Feil, PA-C  pantoprazole (PROTONIX) 40 MG tablet Take 1 tablet (40 mg total) by mouth daily. 08/05/17   Dale Winston, MD  predniSONE (DELTASONE) 20 MG tablet Take 1 tablet (20 mg total) by mouth daily. 06/06/17   Payton Mccallum, MD    Family History Family History  Adopted: Yes    Social History Social History  Tobacco Use  . Smoking status: Former Smoker    Types: Cigarettes  . Smokeless tobacco: Never Used  . Tobacco comment: socially   Substance Use Topics  . Alcohol use: Yes    Alcohol/week: 0.0 standard drinks    Comment: socially  . Drug use: No     Allergies   Patient has no known allergies.   Review of Systems Review of Systems  Constitutional: Positive for activity change, chills, diaphoresis, fatigue and fever.  HENT: Positive for congestion.   All other systems reviewed and are negative.    Physical Exam Triage Vital Signs ED Triage Vitals  Enc Vitals Group     BP 09/08/18 1812 (!) 149/91     Pulse Rate 09/08/18 1812 (!) 135     Resp 09/08/18 1812 18     Temp 09/08/18 1812 99.6 F (37.6 C)      Temp Source 09/08/18 1812 Oral     SpO2 09/08/18 1812 99 %     Weight 09/08/18 1816 280 lb (127 kg)     Height --      Head Circumference --      Peak Flow --      Pain Score 09/08/18 1816 6     Pain Loc --      Pain Edu? --      Excl. in GC? --    No data found.  Updated Vital Signs BP (!) 149/91 (BP Location: Left Arm)   Pulse (!) 135   Temp 99.6 F (37.6 C) (Oral)   Resp 18   Wt 280 lb (127 kg)   SpO2 99%   BMI 37.97 kg/m   Visual Acuity Right Eye Distance:   Left Eye Distance:   Bilateral Distance:    Right Eye Near:   Left Eye Near:    Bilateral Near:     Physical Exam  Constitutional: He is oriented to person, place, and time. He appears well-developed and well-nourished. No distress.  Appears ill but not toxic  HENT:  Head: Normocephalic.  Right Ear: External ear normal.  Left Ear: External ear normal.  Nose: Nose normal.  Mouth/Throat: Oropharynx is clear and moist. No oropharyngeal exudate.  Eyes: Pupils are equal, round, and reactive to light. Right eye exhibits no discharge. Left eye exhibits no discharge.  Neck: Normal range of motion.  Pulmonary/Chest: Effort normal and breath sounds normal.  Musculoskeletal: Normal range of motion.  Neurological: He is alert and oriented to person, place, and time.  Skin: Skin is warm and dry. He is not diaphoretic.  Psychiatric: He has a normal mood and affect. His behavior is normal. Judgment and thought content normal.  Nursing note and vitals reviewed.    UC Treatments / Results  Labs (all labs ordered are listed, but only abnormal results are displayed) Labs Reviewed  RAPID INFLUENZA A&B ANTIGENS ([facility] ONLY) - Abnormal; Notable for the following components:      Result Value   Influenza A ([facility]) POSITIVE (*)    All other components within normal limits    EKG None  Radiology No results found.  Procedures Procedures (including critical care time)  Medications Ordered in UC Medications - No  data to display  Initial Impression / Assessment and Plan / UC Course  I have reviewed the triage vital signs and the nursing notes.  Pertinent labs & imaging results that were available during my care of the patient were reviewed by me and considered in my medical decision making (  see chart for details)  . Tested positive for influenza A.  Told him that he needs to be careful around his wife and his son.  He should wear a mask when he is near them and wash his hands frequently.  He should not handle the baby at all if possible.  We will start him on Tamiflu today as well as providing him with Tessalon Perles.  He does not have a cough at the present time but may develop which I alerted him to.  I also told him to not return to work until he is afebrile for 24 hours.  If he worsens he should go to the emergency room.     Final Clinical Impressions(s) / UC Diagnoses   Final diagnoses:  Influenza     Discharge Instructions     Rest as much as possible.  Drink plenty of fluids.  Use Tylenol or Motrin for fever or body aches.  Do not return to work until your fever has broken for 24 hours.  Wash hands frequently and wear a mask when around your child wife    ED Prescriptions    Medication Sig Dispense Auth. Provider   oseltamivir (TAMIFLU) 75 MG capsule Take 1 capsule (75 mg total) by mouth every 12 (twelve) hours. 10 capsule Lutricia Feil, PA-C   benzonatate (TESSALON) 200 MG capsule Take one cap TID PRN cough 30 capsule Lutricia Feil, PA-C     Controlled Substance Prescriptions [place] Controlled Substance Registry consulted? Not Applicable   Lutricia Feil, PA-C 09/08/18 1904

## 2018-10-26 ENCOUNTER — Ambulatory Visit
Admission: EM | Admit: 2018-10-26 | Discharge: 2018-10-26 | Disposition: A | Discharge status: Home / Self Care | Payer: BLUE CROSS/BLUE SHIELD | Attending: Family Medicine | Admitting: Family Medicine

## 2018-10-26 ENCOUNTER — Encounter: Payer: Self-pay | Admitting: Emergency Medicine

## 2018-10-26 ENCOUNTER — Other Ambulatory Visit: Payer: Self-pay

## 2018-10-26 DIAGNOSIS — J069 Acute upper respiratory infection, unspecified: Secondary | ICD-10-CM

## 2018-10-26 DIAGNOSIS — B9789 Other viral agents as the cause of diseases classified elsewhere: Secondary | ICD-10-CM | POA: Insufficient documentation

## 2018-10-26 LAB — RAPID STREP SCREEN (MED CTR MEBANE ONLY): Streptococcus, Group A Screen (Direct): NEGATIVE

## 2018-10-26 MED ORDER — HYDROCOD POLST-CPM POLST ER 10-8 MG/5ML PO SUER: 5.0000 mL | Freq: Two times a day (BID) | ORAL | 0 refills | Status: DC | PRN

## 2018-10-26 MED ORDER — IPRATROPIUM BROMIDE 0.06 % NA SOLN: 2.0000 | Freq: Four times a day (QID) | NASAL | 0 refills | Status: DC | PRN

## 2018-10-26 NOTE — ED Triage Notes (Signed)
Pt c/o sore throat, cough, fatigue, nasal congestion. Started this morning.

## 2018-10-26 NOTE — ED Provider Notes (Signed)
[facility]    CSN: [id number] Arrival date & time: 10/26/18  5621  History   Chief Complaint Chief Complaint  Patient presents with  . Sore Throat   HPI  32 year old male presents with respiratory symptoms.  Patient reports that his symptoms started yesterday.  Reports sore throat, cough, runny nose, fatigue.  No fever.  No medications or interventions tried.  Patient reports that he has numerous sick contacts at work.  He states that some of his coworkers are actually in the waiting room here today.  No known exacerbating or relieving factors.  Symptoms are worse at night.  No other complaints.  PMH, Surgical Hx, Family Hx, Social History reviewed and updated as below.  Past Medical History:  Diagnosis Date  . Back pain   . GERD (gastroesophageal reflux disease)   . Syncope    one episode worked up (cardiology and endocrinology).  felt to be related to hypoglycemia   Patient Active Problem List   Diagnosis Date Noted  . Rectal bleeding 05/27/2017  . GERD (gastroesophageal reflux disease) 07/02/2016  . Nasal congestion 07/02/2016  . Skin lesion 02/01/2016  . Left elbow pain 10/05/2015  . Health care maintenance 10/05/2015  . Ganglion of right hand 03/17/2015  . Back pain 10/29/2014  . Stress 10/29/2014  . Chest pain 10/29/2014  . Low testosterone 10/24/2014  . Erectile dysfunction 12/21/2013    Past Surgical History:  Procedure Laterality Date  . ANKLE SURGERY  2003  . LEG SURGERY    . WISDOM TOOTH EXTRACTION      Home Medications    Prior to Admission medications   Medication Sig Start Date End Date Taking? Authorizing Provider  pantoprazole (PROTONIX) 40 MG tablet Take 1 tablet (40 mg total) by mouth daily. 08/05/17  Yes Dale Montrose, MD  chlorpheniramine-HYDROcodone [facility] PENNKINETIC ER) 10-8 MG/5ML SUER Take 5 mLs by mouth every 12 (twelve) hours as needed. 10/26/18   Tommie Sams, DO  etodolac (LODINE) 400 MG tablet Take 1 tablet (400  mg total) by mouth 2 (two) times daily as needed. 07/06/17   Dale Eagle, MD  ipratropium (ATROVENT) 0.06 % nasal spray Place 2 sprays into both nostrils 4 (four) times daily as needed for rhinitis. 10/26/18   Tommie Sams, DO  ondansetron (ZOFRAN) 4 MG tablet Take 2 tablets (8 mg total) by mouth every 4 (four) hours as needed for nausea or vomiting. 02/08/18   Isa Rankin, MD    Family History Family History  Adopted: Yes    Social History Social History   Tobacco Use  . Smoking status: Former Smoker    Types: Cigarettes  . Smokeless tobacco: Never Used  . Tobacco comment: socially   Substance Use Topics  . Alcohol use: Yes    Alcohol/week: 0.0 standard drinks    Comment: socially  . Drug use: No     Allergies   Patient has no known allergies.   Review of Systems Review of Systems  Constitutional: Positive for fatigue. Negative for fever.  HENT: Positive for rhinorrhea and sore throat.   Respiratory: Positive for cough.    Physical Exam Triage Vital Signs ED Triage Vitals  Enc Vitals Group     BP 10/26/18 0843 137/73     Pulse Rate 10/26/18 0843 (!) 107     Resp 10/26/18 0843 18     Temp 10/26/18 0843 98.5 F (36.9 C)     Temp Source 10/26/18 0843 Oral  SpO2 10/26/18 0843 99 %     Weight 10/26/18 0839 280 lb (127 kg)     Height 10/26/18 0839 6' (1.829 m)     Head Circumference --      Peak Flow --      Pain Score 10/26/18 0838 4     Pain Loc --      Pain Edu? --      Excl. in GC? --    Updated Vital Signs BP 137/73 (BP Location: Left Arm)   Pulse (!) 107   Temp 98.5 F (36.9 C) (Oral)   Resp 18   Ht 6' (1.829 m)   Wt 127 kg   SpO2 99%   BMI 37.97 kg/m   Visual Acuity Right Eye Distance:   Left Eye Distance:   Bilateral Distance:    Right Eye Near:   Left Eye Near:    Bilateral Near:     Physical Exam Vitals signs and nursing note reviewed.  Constitutional:      Appearance: Normal appearance. He is not ill-appearing or  toxic-appearing.  HENT:     Head: Normocephalic and atraumatic.     Right Ear: Tympanic membrane normal.     Left Ear: Tympanic membrane normal.     Mouth/Throat:     Comments: Oropharynx with mild erythema. Eyes:     General:        Right eye: No discharge.        Left eye: No discharge.     Conjunctiva/sclera: Conjunctivae normal.  Neck:     Musculoskeletal: Neck supple.  Cardiovascular:     Rate and Rhythm: Normal rate and regular rhythm.  Pulmonary:     Effort: Pulmonary effort is normal. No respiratory distress.     Breath sounds: No wheezing, rhonchi or rales.  Lymphadenopathy:     Cervical: No cervical adenopathy.  Neurological:     Mental Status: He is alert and oriented to person, place, and time.  Psychiatric:        Mood and Affect: Mood normal.        Behavior: Behavior normal.    UC Treatments / Results  Labs (all labs ordered are listed, but only abnormal results are displayed) Labs Reviewed  RAPID STREP SCREEN ([facility] ONLY)  CULTURE, GROUP A STREP [facility])    EKG None  Radiology No results found.  Procedures Procedures (including critical care time)  Medications Ordered in UC Medications - No data to display  Initial Impression / Assessment and Plan / UC Course  I have reviewed the triage vital signs and the nursing notes.  Pertinent labs & imaging results that were available during my care of the patient were reviewed by me and considered in my medical decision making (see chart for details).    32 year old male presents with a viral URI with cough.  Treating with Tussionex and Atrovent nasal spray.  Final Clinical Impressions(s) / UC Diagnoses   Final diagnoses:  Viral URI with cough     Discharge Instructions     Meds as prescribed.  Take care  Dr. Adriana Simas    ED Prescriptions    Medication Sig Dispense Auth. Provider   chlorpheniramine-HYDROcodone (TUSSIONEX PENNKINETIC ER) 10-8 MG/5ML SUER  (Status: Discontinued) Take 5  mLs by mouth every 12 (twelve) hours as needed. 60 mL Syerra Abdelrahman G, DO   ipratropium (ATROVENT) 0.06 % nasal spray  (Status: Discontinued) Place 2 sprays into both nostrils 4 (four) times daily as needed for  rhinitis. 15 mL Anairis Knick G, DO   chlorpheniramine-HYDROcodone (TUSSIONEX PENNKINETIC ER) 10-8 MG/5ML SUER Take 5 mLs by mouth every 12 (twelve) hours as needed. 60 mL Saliah Crisp G, DO   ipratropium (ATROVENT) 0.06 % nasal spray Place 2 sprays into both nostrils 4 (four) times daily as needed for rhinitis. 15 mL Tommie Samsook, Colbey Wirtanen G, DO     Controlled Substance Prescriptions [place] Controlled Substance Registry consulted? Not Applicable   Tommie SamsCook, Sharnae Winfree G, DO 10/26/18 [id number]

## 2018-10-26 NOTE — Discharge Instructions (Signed)
Meds as prescribed. ° °Take care ° °Dr. Armour Villanueva  °

## 2018-10-29 LAB — CULTURE, GROUP A STREP (THRC)

## 2018-12-25 ENCOUNTER — Other Ambulatory Visit: Payer: Self-pay | Admitting: Internal Medicine

## 2018-12-25 NOTE — Telephone Encounter (Signed)
Requested medication (s) are due for refill today: Yes  Requested medication (s) are on the active medication list: Yes  Last refill:  07/04/17  Future visit scheduled: Yes  Notes to clinic:  Unable to refill per protocol, expired Rx, failed items on protocol.     Requested Prescriptions  Pending Prescriptions Disp Refills   etodolac (LODINE) 400 MG tablet 30 tablet 0    Sig: Take 1 tablet (400 mg total) by mouth 2 (two) times daily as needed.     Analgesics:  NSAIDS Failed - 12/25/2018  2:04 PM      Failed - Cr in normal range and within 360 days    Creatinine, Ser  Date Value Ref Range Status  01/13/2016 0.86 0.40 - 1.50 mg/dL Final         Failed - HGB in normal range and within 360 days    Hemoglobin  Date Value Ref Range Status  05/27/2017 15.4 13.0 - 17.0 g/dL Final         Failed - Valid encounter within last 12 months    Recent Outpatient Visits          1 year ago Internal nasal lesion   [facility] [place], Westley Hummer, MD   1 year ago Gastroesophageal reflux disease, esophagitis presence not specified   [facility] [place], Westley Hummer, MD   2 years ago Stress   [facility] [place], Westley Hummer, MD   2 years ago Normal routine physical examination   [facility] [employer], Westley Hummer, MD   2 years ago Low testosterone   [facility] [place], Westley Hummer, MD      Future Appointments            In 3 months Dale Robeson, MD [facility] [place], [facility]           Passed - Patient is not pregnant

## 2018-12-25 NOTE — Telephone Encounter (Signed)
Copied from CRM [id number]. Topic: Quick Communication - See Telephone Encounter >> Dec 25, 2018  9:36 AM Trula Slade wrote: CRM for notification. See Telephone encounter for: 12/25/18. Patient would like a refill on his etodolac (LODINE) 400 MG tablet medication because he is experiencing really bad back pain right now.  Please send to his preferred pharmacy [facility] in [place].

## 2018-12-25 NOTE — Telephone Encounter (Signed)
Patient called and advised he has not been in the office since 2018 and an appointment for a physical is needed before approval of medication refills. He agrees to the appointment and asks if Dr. Lorin Picket will go ahead and refill, since this is something he always takes for pain. I advised I will send the request to her for approval. Appointment for physical scheduled at first available on Thursday, 04/13/19 at 1530 with Dr. Lorin Picket.

## 2018-12-26 NOTE — Telephone Encounter (Signed)
Have not seen him since 05/2017.  Needs labs prior to refill.  Also needs f/u appt.

## 2018-12-27 ENCOUNTER — Other Ambulatory Visit: Payer: Self-pay | Admitting: Internal Medicine

## 2018-12-27 DIAGNOSIS — M545 Low back pain, unspecified: Secondary | ICD-10-CM

## 2018-12-27 DIAGNOSIS — R739 Hyperglycemia, unspecified: Secondary | ICD-10-CM

## 2018-12-27 DIAGNOSIS — Z13220 Encounter for screening for lipoid disorders: Secondary | ICD-10-CM

## 2018-12-27 NOTE — Telephone Encounter (Signed)
Orders placed for f/u labs.  

## 2018-12-27 NOTE — Telephone Encounter (Signed)
Pt scheduled for fasting labs on Friday. Not sure what all you wanted ordered.

## 2018-12-27 NOTE — Progress Notes (Signed)
Order placed for labs.

## 2018-12-29 ENCOUNTER — Other Ambulatory Visit: Payer: BLUE CROSS/BLUE SHIELD

## 2019-04-13 ENCOUNTER — Ambulatory Visit (INDEPENDENT_AMBULATORY_CARE_PROVIDER_SITE_OTHER): Payer: BLUE CROSS/BLUE SHIELD | Admitting: Internal Medicine

## 2019-04-13 ENCOUNTER — Other Ambulatory Visit: Payer: Self-pay

## 2019-04-13 ENCOUNTER — Encounter: Payer: Self-pay | Admitting: Internal Medicine

## 2019-04-13 DIAGNOSIS — F439 Reaction to severe stress, unspecified: Secondary | ICD-10-CM

## 2019-04-13 DIAGNOSIS — N528 Other male erectile dysfunction: Secondary | ICD-10-CM | POA: Diagnosis not present

## 2019-04-13 DIAGNOSIS — M545 Low back pain, unspecified: Secondary | ICD-10-CM

## 2019-04-13 DIAGNOSIS — K219 Gastro-esophageal reflux disease without esophagitis: Secondary | ICD-10-CM

## 2019-04-13 NOTE — Progress Notes (Signed)
Patient ID: Ian HakimBrandon L Pineda, male   DOB: 07-07-1986, [age] y.o.   MRN: [id number]   Virtual Visit via video Note  This visit type was conducted due to national recommendations for restrictions regarding the COVID-19 pandemic (e.g. social distancing).  This format is felt to be most appropriate for this patient at this time.  All issues noted in this document were discussed and addressed.  No physical exam was performed (except for noted visual exam findings with Video Visits).   I connected with Ian Pineda by a video enabled telemedicine application and verified that I am speaking with the correct person using two identifiers. Location patient: car  Location provider: work Persons participating in the virtual visit: patient, provider  I discussed the limitations, risks, security and privacy concerns of performing an evaluation and management service by video and the availability of in person appointments.  The patient expressed understanding and agreed to proceed.   Reason for visit: scheduled follow up.    HPI: He reports he is doing well.  Feels good.  Since his last visit.  Wife had a baby.  Doing well.  He is working now.  Was laid off for a while. Increased stress related to this and increased bills, etc.   No known COVID exposure.  No fever.  No cough, congestion or sob.  No chest pain.  No acid reflux if he takes otc nexium.  States this controls his symptoms.  No abdominal pain.  Bowels moving. Was referred to GI secondary to rectal bleeding.  Had EGD and colonoscopy - per pt.  Was told to continue PPI.  Had some issues with sleeping since he has been out of work.  Taking otc sleep aid.  Discussed melatonin.  Hold on other medications.  He is back in his routine now.  Will follow.  Has had some issues with his back.  Better now.     ROS: See pertinent positives and negatives per HPI.  Past Medical History:  Diagnosis Date  . Back pain   . GERD (gastroesophageal reflux disease)   .  Syncope    one episode worked up (cardiology and endocrinology).  felt to be related to hypoglycemia    Past Surgical History:  Procedure Laterality Date  . ANKLE SURGERY  2003  . LEG SURGERY    . WISDOM TOOTH EXTRACTION      Family History  Adopted: Yes    SOCIAL HX: reviewed.    Current Outpatient Medications:  .  chlorpheniramine-HYDROcodone (TUSSIONEX PENNKINETIC ER) 10-8 MG/5ML SUER, Take 5 mLs by mouth every 12 (twelve) hours as needed., Disp: 60 mL, Rfl: 0 .  etodolac (LODINE) 400 MG tablet, Take 1 tablet (400 mg total) by mouth 2 (two) times daily as needed., Disp: 30 tablet, Rfl: 0 .  ipratropium (ATROVENT) 0.06 % nasal spray, Place 2 sprays into both nostrils 4 (four) times daily as needed for rhinitis., Disp: 15 mL, Rfl: 0 .  ondansetron (ZOFRAN) 4 MG tablet, Take 2 tablets (8 mg total) by mouth every 4 (four) hours as needed for nausea or vomiting., Disp: 20 tablet, Rfl: 0 .  pantoprazole (PROTONIX) 40 MG tablet, Take 1 tablet (40 mg total) by mouth daily., Disp: 90 tablet, Rfl: 1  EXAM:  GENERAL: alert, oriented, appears well and in no acute distress  HEENT: atraumatic, conjunttiva clear, no obvious abnormalities on inspection of external nose and ears  NECK: normal movements of the head and neck  LUNGS: on inspection no signs of  respiratory distress, breathing rate appears normal, no obvious gross SOB, gasping or wheezing  CV: no obvious cyanosis  PSYCH/NEURO: pleasant and cooperative, no obvious depression or anxiety, speech and thought processing grossly intact  ASSESSMENT AND PLAN:  Discussed the following assessment and plan:  Low back pain, unspecified back pain laterality, unspecified chronicity, unspecified whether sciatica present  Other male erectile dysfunction  Gastroesophageal reflux disease, esophagitis presence not specified  Stress  Back pain Better now.  Currently without symptoms.  Follow.    Erectile dysfunction Request refill for  viagra.  Follow.   GERD (gastroesophageal reflux disease) Controlled if takes otc nexium.  Is s/p EGD.  Obtain results.  Stress Doing well.  Follow.  Not sleeping as well recently - since laid off.  Now working regularly now.  Discussed melatonin. Hold on further medication.  Follow.      I discussed the assessment and treatment plan with the patient. The patient was provided an opportunity to ask questions and all were answered. The patient agreed with the plan and demonstrated an understanding of the instructions.   The patient was advised to call back or seek an in-person evaluation if the symptoms worsen or if the condition fails to improve as anticipated.    Dale Pipestone, MD

## 2019-04-15 ENCOUNTER — Encounter: Payer: Self-pay | Admitting: Internal Medicine

## 2019-04-15 MED ORDER — SILDENAFIL CITRATE 50 MG PO TABS: 50.0000 mg | ORAL_TABLET | Freq: Every day | ORAL | 0 refills | Status: DC | PRN

## 2019-04-15 NOTE — Assessment & Plan Note (Signed)
Doing well.  Follow.  Not sleeping as well recently - since laid off.  Now working regularly now.  Discussed melatonin. Hold on further medication.  Follow.

## 2019-04-15 NOTE — Assessment & Plan Note (Signed)
Request refill for viagra.  Follow.

## 2019-04-15 NOTE — Assessment & Plan Note (Signed)
Better now.  Currently without symptoms.  Follow.

## 2019-04-15 NOTE — Assessment & Plan Note (Signed)
Controlled if takes otc nexium.  Is s/p EGD.  Obtain results.

## 2019-05-15 ENCOUNTER — Other Ambulatory Visit: Payer: Self-pay

## 2019-05-15 ENCOUNTER — Telehealth: Payer: Self-pay | Admitting: Family Medicine

## 2019-05-15 ENCOUNTER — Ambulatory Visit (INDEPENDENT_AMBULATORY_CARE_PROVIDER_SITE_OTHER): Payer: BC Managed Care – PPO | Admitting: Family Medicine

## 2019-05-15 DIAGNOSIS — S99912A Unspecified injury of left ankle, initial encounter: Secondary | ICD-10-CM

## 2019-05-15 DIAGNOSIS — M79672 Pain in left foot: Secondary | ICD-10-CM | POA: Diagnosis not present

## 2019-05-15 DIAGNOSIS — M25572 Pain in left ankle and joints of left foot: Secondary | ICD-10-CM

## 2019-05-15 NOTE — Telephone Encounter (Signed)
Called Pt No answer left a VM

## 2019-05-15 NOTE — Telephone Encounter (Signed)
Can this be in office??  If needs ankle xray, would be easier to get if he is here rather than doing a doxy and setting up another appt to come in and get xray

## 2019-05-15 NOTE — Progress Notes (Signed)
Patient ID: Ian Pineda, male   DOB: September 04, 1986, 33 y.o.   MRN: [id number]    Virtual Visit via video Note  This visit type was conducted due to national recommendations for restrictions regarding the COVID-19 pandemic (e.g. social distancing).  This format is felt to be most appropriate for this patient at this time.  All issues noted in this document were discussed and addressed.  No physical exam was performed (except for noted visual exam findings with Video Visits).   I connected with Ian Pineda today at  3:20 PM EDT by a video enabled telemedicine application and verified that I am speaking with the correct person using two identifiers. Location patient: home Location provider: [facility] Persons participating in the virtual visit: patient, provider  I discussed the limitations, risks, security and privacy concerns of performing an evaluation and management service by video and the availability of in person appointments. I also discussed with the patient that there may be a patient responsible charge related to this service. The patient expressed understanding and agreed to proceed.  HPI:  Patient and I connected via video due to left ankle pain.  States 1 week ago he was walking in his yard and rolled ankle when stepped and part of ground that was uneven.  Patient states he has been trying to wrap an Ace wrap around the ankle, but continues to have pain and some swelling.  States over the course of this past week he has been able to walk on ankle more and more.  He is able to put full weight on ankle, but by end of day ankle and left foot are very sore.  States when at home he usually wears flip-flops or crocs, but does wear supportive steel toe boots while at work.  He has taken a few doses of ibuprofen which have helped calm the pain in the left ankle and foot.  Denies any redness of skin or hot feeling skin. Denies bruising.  He is able to wiggle all his toes, he is able to point  and flex his foot, he is able to roll his foot and ankle joint.  Denies any pain in the knees or the hips.  ROS: See pertinent positives and negatives per HPI.  Past Medical History:  Diagnosis Date  . Back pain   . GERD (gastroesophageal reflux disease)   . Syncope    one episode worked up (cardiology and endocrinology).  felt to be related to hypoglycemia    Past Surgical History:  Procedure Laterality Date  . ANKLE SURGERY  2003  . LEG SURGERY    . WISDOM TOOTH EXTRACTION      Family History  Adopted: Yes   Social History   Tobacco Use  . Smoking status: Former Smoker    Types: Cigarettes  . Smokeless tobacco: Never Used  . Tobacco comment: socially   Substance Use Topics  . Alcohol use: Yes    Alcohol/week: 0.0 standard drinks    Comment: socially    Current Outpatient Medications:  .  chlorpheniramine-HYDROcodone (TUSSIONEX PENNKINETIC ER) 10-8 MG/5ML SUER, Take 5 mLs by mouth every 12 (twelve) hours as needed., Disp: 60 mL, Rfl: 0 .  etodolac (LODINE) 400 MG tablet, Take 1 tablet (400 mg total) by mouth 2 (two) times daily as needed., Disp: 30 tablet, Rfl: 0 .  ipratropium (ATROVENT) 0.06 % nasal spray, Place 2 sprays into both nostrils 4 (four) times daily as needed for rhinitis., Disp: 15 mL, Rfl: 0 .  ondansetron (ZOFRAN) 4 MG tablet, Take 2 tablets (8 mg total) by mouth every 4 (four) hours as needed for nausea or vomiting., Disp: 20 tablet, Rfl: 0 .  pantoprazole (PROTONIX) 40 MG tablet, Take 1 tablet (40 mg total) by mouth daily., Disp: 90 tablet, Rfl: 1 .  sildenafil (VIAGRA) 50 MG tablet, Take 1 tablet (50 mg total) by mouth daily as needed for erectile dysfunction., Disp: 30 tablet, Rfl: 0  EXAM:  GENERAL: alert, oriented, appears well and in no acute distress  HEENT: atraumatic, conjunttiva clear, no obvious abnormalities on inspection of external nose and ears  NECK: normal movements of the head and neck  LUNGS: on inspection no signs of respiratory  distress, breathing rate appears normal, no obvious gross SOB, gasping or wheezing  CV: no obvious cyanosis  MS: moves all visible extremities without noticeable abnormality. Left ankle/foot look normal over video feed, no deformity or obvious bruising noted. He can point/flex foot, wiggle toes and rolls left ankle without problems.  PSYCH/NEURO: pleasant and cooperative, no obvious depression or anxiety, speech and thought processing grossly intact  ASSESSMENT AND PLAN:  Discussed the following assessment and plan:  Left ankle pain, foot pain, left ankle injury- suspect patient sprained ankle when he stepped on uneven ground.  Advised patient that sprains can take 6 to 8 weeks to completely heal.  Did offer to have patient come into clinic for x-ray, but he declines coming at this time due to having a small baby and would prefer not to have to come in the office if he does not have to.  Advised to use Ace wrap on ankle and foot to add support to the ankle joint & weight bear as tolerated. Also suggested he can go to the local pharmacy and get a stiffer ankle brace that he can wear on days he knows he will have to be on feet longer.  Advised to wear supportive shoes like boots or sneakers that he can tie snugly.  Advised to elevate foot on pillow and use ice as needed to help calm pain.  Advised he can continue use ibuprofen as needed for pain as well.  Made patient aware that if he does change his mind about getting an x-ray, to call office to let us know and we can get x-rays ordered and get him scheduled.   I discussed the assessment and treatment plan with the patient. The patient was provided an opportunity to ask questions and all were answered. The patient agreed with the plan and demonstrated an understanding of the instructions.   The patient was advised to call back or seek an in-person evaluation if the symptoms worsen or if the condition fails to improve as anticipated.  15 minutes  spent over video call with patient discussing plan of care options  Tracey HarriesLauren M Chasitie Passey, FNP

## 2019-05-16 ENCOUNTER — Encounter: Payer: Self-pay | Admitting: Family Medicine

## 2019-08-27 ENCOUNTER — Other Ambulatory Visit: Payer: Self-pay

## 2019-08-27 DIAGNOSIS — Z20822 Contact with and (suspected) exposure to covid-19: Secondary | ICD-10-CM

## 2019-08-28 LAB — NOVEL CORONAVIRUS, NAA: SARS-CoV-2, NAA: DETECTED — AB

## 2019-09-11 ENCOUNTER — Ambulatory Visit (INDEPENDENT_AMBULATORY_CARE_PROVIDER_SITE_OTHER): Payer: BC Managed Care – PPO | Admitting: Internal Medicine

## 2019-09-11 ENCOUNTER — Telehealth: Payer: Self-pay | Admitting: Internal Medicine

## 2019-09-11 DIAGNOSIS — N528 Other male erectile dysfunction: Secondary | ICD-10-CM

## 2019-09-11 DIAGNOSIS — K219 Gastro-esophageal reflux disease without esophagitis: Secondary | ICD-10-CM

## 2019-09-11 DIAGNOSIS — U071 COVID-19: Secondary | ICD-10-CM

## 2019-09-11 MED ORDER — SILDENAFIL CITRATE 50 MG PO TABS: 50.0000 mg | ORAL_TABLET | Freq: Every day | ORAL | 0 refills | Status: DC | PRN

## 2019-09-11 NOTE — Telephone Encounter (Signed)
Letter sent to pt for work release.

## 2019-09-11 NOTE — Telephone Encounter (Signed)
Please send the letter. Thanks

## 2019-09-11 NOTE — Progress Notes (Signed)
Patient ID: Ian Pineda, male   DOB: [birth year], 33 y.o.   MRN: [id number]   Virtual Visit via video Note  This visit type was conducted due to national recommendations for restrictions regarding the COVID-19 pandemic (e.g. social distancing).  This format is felt to be most appropriate for this patient at this time.  All issues noted in this document were discussed and addressed.  No physical exam was performed (except for noted visual exam findings with Video Visits).   I connected with Sammie Bench by a video enabled telemedicine application and verified that I am speaking with the correct person using two identifiers. Location patient: home Location provider: work  Persons participating in the virtual visit: patient, provider  I discussed the limitations, risks, security and privacy concerns of performing an evaluation and management service by telephone and the availability of in person appointments.  The patient expressed understanding and agreed to proceed.   Reason for visit: work in appt.      HPI: Pt reports that went to a wedding beginning 08/2019.  Wedding was outside.  5-6 days later, they were notified that multiple people at the wedding had tested positive for covid.  On 08/26/19 - his son developed fever.  Taken to MD.  covid positive.  Son is doing fine.  Was recommended for parents to be tested.  Both [place] and his wife tested positive.  Positive test - 08/28/19.  He report no fever.  Minimal cough and minimal runny nose.  Taking mucinex.  Previous diarrhea, but this has resolved.  Eating well. Feels back to normal.  No significant symptoms.  Needs letter to return to work.    ROS: See pertinent positives and negatives per HPI.  Past Medical History:  Diagnosis Date  . Back pain   . GERD (gastroesophageal reflux disease)   . Syncope    one episode worked up (cardiology and endocrinology).  felt to be related to hypoglycemia    Past Surgical History:  Procedure  Laterality Date  . ANKLE SURGERY  2003  . LEG SURGERY    . WISDOM TOOTH EXTRACTION      Family History  Adopted: Yes    SOCIAL HX: reviewed.    Current Outpatient Medications:  .  chlorpheniramine-HYDROcodone (TUSSIONEX PENNKINETIC ER) 10-8 MG/5ML SUER, Take 5 mLs by mouth every 12 (twelve) hours as needed., Disp: 60 mL, Rfl: 0 .  etodolac (LODINE) 400 MG tablet, Take 1 tablet (400 mg total) by mouth 2 (two) times daily as needed., Disp: 30 tablet, Rfl: 0 .  ipratropium (ATROVENT) 0.06 % nasal spray, Place 2 sprays into both nostrils 4 (four) times daily as needed for rhinitis., Disp: 15 mL, Rfl: 0 .  ondansetron (ZOFRAN) 4 MG tablet, Take 2 tablets (8 mg total) by mouth every 4 (four) hours as needed for nausea or vomiting., Disp: 20 tablet, Rfl: 0 .  pantoprazole (PROTONIX) 40 MG tablet, Take 1 tablet (40 mg total) by mouth daily., Disp: 90 tablet, Rfl: 1 .  sildenafil (VIAGRA) 50 MG tablet, Take 1 tablet (50 mg total) by mouth daily as needed for erectile dysfunction., Disp: 30 tablet, Rfl: 0  EXAM:  GENERAL: alert, oriented, appears well and in no acute distress  HEENT: atraumatic, conjunttiva clear, no obvious abnormalities on inspection of external nose and ears  NECK: normal movements of the head and neck  LUNGS: on inspection no signs of respiratory distress, breathing rate appears normal, no obvious gross SOB, gasping or wheezing  CV:  no obvious cyanosis  PSYCH/NEURO: pleasant and cooperative, no obvious depression or anxiety, speech and thought processing grossly intact  ASSESSMENT AND PLAN:  Discussed the following assessment and plan:  COVID-19 Recently diagnosed.  Has quarantined for two weeks.  No fever.  No significant symptoms now.  Feels good.  Needs letter to return to work.    Erectile dysfunction Refilled generic viagra.   GERD (gastroesophageal reflux disease) Controlled.      I discussed the assessment and treatment plan with the patient. The  patient was provided an opportunity to ask questions and all were answered. The patient agreed with the plan and demonstrated an understanding of the instructions.   The patient was advised to call back or seek an in-person evaluation if the symptoms worsen or if the condition fails to improve as anticipated.   Dale Isleton, MD

## 2019-09-16 ENCOUNTER — Encounter: Payer: Self-pay | Admitting: Internal Medicine

## 2019-09-16 DIAGNOSIS — U071 COVID-19: Secondary | ICD-10-CM | POA: Insufficient documentation

## 2019-09-16 NOTE — Assessment & Plan Note (Signed)
Recently diagnosed.  Has quarantined for two weeks.  No fever.  No significant symptoms now.  Feels good.  Needs letter to return to work.

## 2019-09-16 NOTE — Assessment & Plan Note (Signed)
Refilled generic viagra.

## 2019-09-16 NOTE — Assessment & Plan Note (Signed)
Controlled.  

## 2019-10-18 ENCOUNTER — Ambulatory Visit (INDEPENDENT_AMBULATORY_CARE_PROVIDER_SITE_OTHER): Payer: BC Managed Care – PPO | Admitting: Internal Medicine

## 2019-10-18 ENCOUNTER — Encounter: Payer: Self-pay | Admitting: Internal Medicine

## 2019-10-18 ENCOUNTER — Other Ambulatory Visit: Payer: Self-pay

## 2019-10-18 DIAGNOSIS — M545 Low back pain, unspecified: Secondary | ICD-10-CM

## 2019-10-18 DIAGNOSIS — N528 Other male erectile dysfunction: Secondary | ICD-10-CM

## 2019-10-18 DIAGNOSIS — K219 Gastro-esophageal reflux disease without esophagitis: Secondary | ICD-10-CM

## 2019-10-18 DIAGNOSIS — K625 Hemorrhage of anus and rectum: Secondary | ICD-10-CM

## 2019-10-18 DIAGNOSIS — F439 Reaction to severe stress, unspecified: Secondary | ICD-10-CM

## 2019-10-18 NOTE — Progress Notes (Signed)
Patient ID: Ian Pineda, male   DOB: 1986-07-27, 33 y.o.   MRN: [id number]   Virtual Visit via video Note  This visit type was conducted due to national recommendations for restrictions regarding the COVID-19 pandemic (e.g. social distancing).  This format is felt to be most appropriate for this patient at this time.  All issues noted in this document were discussed and addressed.  No physical exam was performed (except for noted visual exam findings with Video Visits).   I connected with Ian Pineda by a video enabled telemedicine application and verified that I am speaking with the correct person using two identifiers. Location patient: home Location provider: work  Persons participating in the virtual visit: patient, provider  I discussed the limitations, risks, security and privacy concerns of performing an evaluation and management service by video and the availability of in person appointments. The patient expressed understanding and agreed to proceed.  Interactive audio and video telecommunications were attempted between this provider and patient, however failed, not able to see pt.  Had to convert to a telephone visit.  We continued and completed visit with audio only. Pt agreed.   Reason for visit: scheduled follow up.   HPI: He reports he is doing relatively well.  Some increased stress with work, but overall handling things relatively well.  Trying to stay active.  No chest pain.  No sob.  No acid reflux.  No abdominal pain.  Bowels moving.  Acid reflux controlled on nexium.  Back is doing well.  Has noticed some blood with wiping.  States noticed one week ago - minimal.  States has not noticed bleeding like he noticed previously and it had been at least a year since previous episode.  Discussed further evaluation and w/up.  He declines.     ROS: See pertinent positives and negatives per HPI.  Past Medical History:  Diagnosis Date  . Back pain   . GERD (gastroesophageal reflux  disease)   . Syncope    one episode worked up (cardiology and endocrinology).  felt to be related to hypoglycemia    Past Surgical History:  Procedure Laterality Date  . ANKLE SURGERY  2003  . LEG SURGERY    . WISDOM TOOTH EXTRACTION      Family History  Adopted: Yes    SOCIAL HX: reviewed.    Current Outpatient Medications:  .  chlorpheniramine-HYDROcodone (TUSSIONEX PENNKINETIC ER) 10-8 MG/5ML SUER, Take 5 mLs by mouth every 12 (twelve) hours as needed., Disp: 60 mL, Rfl: 0 .  etodolac (LODINE) 400 MG tablet, Take 1 tablet (400 mg total) by mouth 2 (two) times daily as needed., Disp: 30 tablet, Rfl: 0 .  ipratropium (ATROVENT) 0.06 % nasal spray, Place 2 sprays into both nostrils 4 (four) times daily as needed for rhinitis., Disp: 15 mL, Rfl: 0 .  ondansetron (ZOFRAN) 4 MG tablet, Take 2 tablets (8 mg total) by mouth every 4 (four) hours as needed for nausea or vomiting., Disp: 20 tablet, Rfl: 0 .  pantoprazole (PROTONIX) 40 MG tablet, Take 1 tablet (40 mg total) by mouth daily., Disp: 90 tablet, Rfl: 1 .  sildenafil (VIAGRA) 50 MG tablet, Take 1 tablet (50 mg total) by mouth daily as needed for erectile dysfunction., Disp: 30 tablet, Rfl: 0  EXAM:  GENERAL: alert, oriented, appears well and in no acute distress  HEENT: atraumatic, conjunttiva clear, no obvious abnormalities on inspection of external nose and ears  NECK: normal movements of the head and neck  LUNGS: on inspection no signs of respiratory distress, breathing rate appears normal, no obvious gross SOB, gasping or wheezing  CV: no obvious cyanosis  PSYCH/NEURO: pleasant and cooperative, no obvious depression or anxiety, speech and thought processing grossly intact  ASSESSMENT AND PLAN:  Discussed the following assessment and plan:  Back pain Doing well.  No recent flare.  Follow.   Erectile dysfunction viagra as needed. Follow.  Has had extensive w/up.   GERD (gastroesophageal reflux disease)  Controlled on current regimen.  Follow.    Rectal bleeding Saw GI previously.  Recurrence as outlined.  Discussed further evaluation and w/up.  He declines.  Wants to follow.  Desires no further intervention.    Stress Increased stress as outlined.  Discussed with him today.  He does not feel needs any further intervention.  Follow.      I discussed the assessment and treatment plan with the patient. The patient was provided an opportunity to ask questions and all were answered. The patient agreed with the plan and demonstrated an understanding of the instructions.   The patient was advised to call back or seek an in-person evaluation if the symptoms worsen or if the condition fails to improve as anticipated.  I provided 18 minutes of non-face-to-face time during this encounter.   Dale [place], MD

## 2019-10-21 ENCOUNTER — Encounter: Payer: Self-pay | Admitting: Internal Medicine

## 2019-10-21 NOTE — Assessment & Plan Note (Signed)
Doing well.  No recent flare.  Follow.  

## 2019-10-21 NOTE — Assessment & Plan Note (Signed)
Increased stress as outlined.  Discussed with him today.  He does not feel needs any further intervention.  Follow.   

## 2019-10-21 NOTE — Assessment & Plan Note (Signed)
Saw GI previously.  Recurrence as outlined.  Discussed further evaluation and w/up.  He declines.  Wants to follow.  Desires no further intervention.

## 2019-10-21 NOTE — Assessment & Plan Note (Signed)
viagra as needed. Follow.  Has had extensive w/up.

## 2019-10-21 NOTE — Assessment & Plan Note (Signed)
Controlled on current regimen.  Follow.  

## 2020-03-04 ENCOUNTER — Encounter: Payer: Self-pay | Admitting: Internal Medicine

## 2020-03-31 ENCOUNTER — Telehealth: Payer: Self-pay | Admitting: Internal Medicine

## 2020-03-31 NOTE — Telephone Encounter (Signed)
Refill request for Viagra, last seen 10-18-19, last filled 09-11-19.  Please advise.

## 2020-03-31 NOTE — Telephone Encounter (Signed)
Pt needs Vi[facility] told him he needed to call us to get it refilled.

## 2020-04-01 MED ORDER — SILDENAFIL CITRATE 50 MG PO TABS: 50.0000 mg | ORAL_TABLET | Freq: Every day | ORAL | 0 refills | Status: AC | PRN

## 2020-04-01 NOTE — Telephone Encounter (Signed)
rx sent in for viagra 

## 2020-04-01 NOTE — Addendum Note (Signed)
Addended by: Charm Barges on: 04/01/2020 04:08 AM   Modules accepted: Orders

## 2020-05-01 ENCOUNTER — Encounter: Payer: BC Managed Care – PPO | Admitting: Internal Medicine

## 2021-01-29 ENCOUNTER — Other Ambulatory Visit: Payer: Self-pay

## 2021-01-29 ENCOUNTER — Ambulatory Visit (INDEPENDENT_AMBULATORY_CARE_PROVIDER_SITE_OTHER): Payer: 59

## 2021-01-29 ENCOUNTER — Ambulatory Visit: Payer: 59 | Admitting: Internal Medicine

## 2021-01-29 ENCOUNTER — Encounter: Payer: Self-pay | Admitting: Internal Medicine

## 2021-01-29 VITALS — BP 130/82 | HR 88 | Temp 98.1°F | Ht 72.0 in | Wt 273.0 lb

## 2021-01-29 DIAGNOSIS — M25562 Pain in left knee: Secondary | ICD-10-CM

## 2021-01-29 MED ORDER — METHYLPREDNISOLONE 4 MG PO TBPK: ORAL_TABLET | ORAL | 0 refills | Status: DC

## 2021-01-29 MED ORDER — TRAMADOL HCL 50 MG PO TABS: 50.0000 mg | ORAL_TABLET | Freq: Two times a day (BID) | ORAL | 0 refills | Status: AC | PRN

## 2021-01-29 NOTE — Progress Notes (Signed)
Chief Complaint  Patient presents with  . Knee Pain    Left knee pain   Acute visit  1. Left knee pain and protrusion below knee cap x 2 weeks worse with swelling after 12 hr shifts standing at work works 2 days on and 3 days off and 3 days on 2 days off in [place] and worked at [employer] 13 years ago. Tried Tylenol, otc nsaids w/o pain pain 10/10 today worse at night and after working. No trauma. The knot on left lower knee has been there x 30 years  It is hard to play with 2 y.o son and limping at times and left calf is sore  Review of Systems  Musculoskeletal: Positive for joint pain.   Past Medical History:  Diagnosis Date  . Back pain   . GERD (gastroesophageal reflux disease)   . Syncope    one episode worked up (cardiology and endocrinology).  felt to be related to hypoglycemia   Past Surgical History:  Procedure Laterality Date  . ANKLE SURGERY  2003  . LEG SURGERY    . WISDOM TOOTH EXTRACTION     Family History  Adopted: Yes   Social History   Socioeconomic History  . Marital status: Married    Spouse name: Deshone Lyssy  . Number of children: 0  . Years of education: 38  . Highest education level: Not on file  Occupational History  . Occupation: Associate Professor  Tobacco Use  . Smoking status: Current Every Day Smoker    Types: Cigarettes  . Smokeless tobacco: Never Used  . Tobacco comment: socially   Vaping Use  . Vaping Use: Never used  Substance and Sexual Activity  . Alcohol use: Yes    Alcohol/week: 0.0 standard drinks    Comment: socially  . Drug use: No  . Sexual activity: Yes  Other Topics Concern  . Not on file  Social History Narrative  . Not on file   Social Determinants of Health   Financial Resource Strain: Not on file  Food Insecurity: Not on file  Transportation Needs: Not on file  Physical Activity: Not on file  Stress: Not on file  Social Connections: Not on file  Intimate Partner Violence: Not on file   Current Meds  Medication Sig   . methylPREDNISolone (MEDROL DOSEPAK) 4 MG TBPK tablet Use as directed taper  . traMADol (ULTRAM) 50 MG tablet Take 1 tablet (50 mg total) by mouth 2 (two) times daily as needed for up to 5 days.   No Known Allergies No results found for this or any previous visit (from the past 2160 hour(s)). Objective  Body mass index is 37.03 kg/m. Wt Readings from Last 3 Encounters:  01/29/21 273 lb (123.8 kg)  10/26/18 280 lb (127 kg)  09/08/18 280 lb (127 kg)   Temp Readings from Last 3 Encounters:  01/29/21 98.1 F (36.7 C) (Oral)  10/26/18 98.5 F (36.9 C) (Oral)  09/08/18 99.6 F (37.6 C) (Oral)   BP Readings from Last 3 Encounters:  01/29/21 130/82  10/26/18 137/73  09/08/18 (!) 149/91   Pulse Readings from Last 3 Encounters:  01/29/21 88  10/26/18 (!) 107  09/08/18 (!) 135    Physical Exam Vitals and nursing note reviewed.  Constitutional:      Appearance: Normal appearance. He is well-developed and well-groomed. He is obese.  HENT:     Head: Normocephalic and atraumatic.  Eyes:     Conjunctiva/sclera: Conjunctivae normal.  Pupils: Pupils are equal, round, and reactive to light.  Cardiovascular:     Rate and Rhythm: Normal rate and regular rhythm.     Heart sounds: Normal heart sounds. No murmur heard.   Pulmonary:     Effort: Pulmonary effort is normal.     Breath sounds: Normal breath sounds.  Musculoskeletal:     Left knee: Crepitus present. No effusion.       Legs:  Skin:    General: Skin is warm and dry.  Neurological:     General: No focal deficit present.     Mental Status: He is alert and oriented to person, place, and time. Mental status is at baseline.     Gait: Gait normal.  Psychiatric:        Attention and Perception: Attention and perception normal.        Mood and Affect: Mood and affect normal.        Speech: Speech normal.        Behavior: Behavior normal. Behavior is cooperative.        Thought Content: Thought content normal.         Cognition and Memory: Cognition and memory normal.        Judgment: Judgment normal.     Assessment  Plan  Acute pain of left knee - Plan: DG Knee Complete 4 Views Left, Ambulatory referral to [facility] in [place], methylPREDNISolone (MEDROL DOSEPAK) 4 MG TBPK tablet, traMADol (ULTRAM) 50 MG tablet  HM F/u with PCP in person in 2-3 months   Provider: Dr. French Ana [facility]

## 2021-01-29 NOTE — Progress Notes (Signed)
Left knee pain ongoing for 2 weeks. Patient has not fallen, injured, or previously injured this knee. States the pain is 10/10 by the end of the day, is 5/10 first thing in the morning. Patient states the pain feels like a pulling/twisting sensation.

## 2021-01-29 NOTE — Patient Instructions (Addendum)
voltaren gel 4x per day  Tylenol max dose 4000 mg total in a day  Tramadol 2x per day as needed and prednisone in am [facility] in [place]  [address]  [place]    [phone] Not available [address]   [place] [address]    Acute Knee Pain, Adult Acute knee pain is sudden and may be caused by damage, swelling, or irritation of the muscles and tissues that support the knee. Pain may result from:  A fall.  An injury to the knee from twisting motions.  A hit to the knee.  Infection. Acute knee pain may go away on its own with time and rest. If it does not, your health care provider may order tests to find the cause of the pain. These may include:  Imaging tests, such as an X-ray, MRI, CT scan, or ultrasound.  Joint aspiration. In this test, fluid is removed from the knee and evaluated.  Arthroscopy. In this test, a lighted tube is inserted into the knee and an image is projected onto a TV screen.  Biopsy. In this test, a sample of tissue is removed from the body and studied under a microscope. Follow these instructions at home: If you have a knee sleeve or brace:  Wear the knee sleeve or brace as told by your health care provider. Remove it only as told by your health care provider.  Loosen it if your toes tingle, become numb, or turn cold and blue.  Keep it clean.  If the knee sleeve or brace is not waterproof: ? Do not let it get wet. ? Cover it with a watertight covering when you take a bath or shower.   Activity  Rest your knee.  Do not do things that cause pain or make pain worse.  Avoid high-impact activities or exercises, such as running, jumping rope, or doing jumping jacks.  Work with a physical therapist to make a safe exercise program, as recommended by your health care provider. Do exercises as told by your physical therapist. Managing pain, stiffness, and swelling  If directed, put ice on the  affected knee. To do this: ? If you have a removable knee sleeve or brace, remove it as told by your health care provider. ? Put ice in a plastic bag. ? Place a towel between your skin and the bag. ? Leave the ice on for 20 minutes, 2-3 times a day. ? Remove the ice if your skin turns bright red. This is very important. If you cannot feel pain, heat, or cold, you have a greater risk of damage to the area.  If directed, use an elastic bandage to put pressure (compression) on your injured knee. This may control swelling, give support, and help with discomfort.  Raise (elevate) your knee above the level of your heart while you are sitting or lying down.  Sleep with a pillow under your knee.   General instructions  Take over-the-counter and prescription medicines only as told by your health care provider.  Do not use any products that contain nicotine or tobacco, such as cigarettes, e-cigarettes, and chewing tobacco. If you need help quitting, ask your health care provider.  If you are overweight, work with your health care provider and a dietitian to set a weight-loss goal that is healthy and reasonable for you. Extra weight can put pressure on your knee.  Pay attention to any changes in your symptoms.  Keep all follow-up visits. This is important. Contact a health care provider if:  Your knee pain continues, changes, or gets worse.  You have a fever along with knee pain.  Your knee feels warm to the touch or is red.  Your knee buckles or locks up. Get help right away if:  Your knee swells, and the swelling becomes worse.  You cannot move your knee.  You have severe pain in your knee that cannot be managed with pain medicine. Summary  Acute knee pain can be caused by a fall, an injury, an infection, or damage, swelling, or irritation of the tissues that support your knee.  Your health care provider may perform tests to find out the cause of the pain.  Pay attention to any  changes in your symptoms. Relieve your pain with rest, medicines, light activity, and the use of ice.  Get help right away if your knee swells, you cannot move your knee, or you have severe pain that cannot be managed with medicine. This information is not intended to replace advice given to you by your health care provider. Make sure you discuss any questions you have with your health care provider. Document Revised: 04/16/2020 Document Reviewed: 04/16/2020 Elsevier Patient Education  2021 [employer].

## 2021-02-02 ENCOUNTER — Telehealth: Payer: Self-pay

## 2021-02-02 NOTE — Telephone Encounter (Signed)
Patient informed and verbalized understanding of knee x-ray results. Documented in result note

## 2021-02-02 NOTE — Telephone Encounter (Signed)
Pt states that he was returning your call but was not sure what it was about. If he does not answer, please leave detailed message

## 2021-02-09 ENCOUNTER — Telehealth: Payer: Self-pay | Admitting: Internal Medicine

## 2021-02-09 NOTE — Telephone Encounter (Signed)
Rejection Reason - Patient went elsewhere - Pt has been seen elsewhere" Gean Maidens said on Feb 09, 2021 11:02 AM  "left msg to return call " Gean Maidens said on Feb 09, 2021 10:46 AM  "left msg to return call " Gean Maidens said on Jan 30, 2021 10:21 AM  [facility]

## 2021-02-20 ENCOUNTER — Ambulatory Visit
Admission: EM | Admit: 2021-02-20 | Discharge: 2021-02-20 | Disposition: A | Discharge status: Home / Self Care | Payer: 59 | Attending: Emergency Medicine | Admitting: Emergency Medicine

## 2021-02-20 ENCOUNTER — Ambulatory Visit (INDEPENDENT_AMBULATORY_CARE_PROVIDER_SITE_OTHER): Payer: 59

## 2021-02-20 ENCOUNTER — Encounter: Payer: Self-pay | Admitting: Emergency Medicine

## 2021-02-20 ENCOUNTER — Other Ambulatory Visit: Payer: Self-pay

## 2021-02-20 DIAGNOSIS — J069 Acute upper respiratory infection, unspecified: Secondary | ICD-10-CM | POA: Insufficient documentation

## 2021-02-20 DIAGNOSIS — Z20822 Contact with and (suspected) exposure to covid-19: Secondary | ICD-10-CM | POA: Diagnosis present

## 2021-02-20 DIAGNOSIS — R059 Cough, unspecified: Secondary | ICD-10-CM

## 2021-02-20 LAB — SARS CORONAVIRUS 2 (TAT 6-24 HRS): SARS Coronavirus 2: NEGATIVE

## 2021-02-20 MED ORDER — IBUPROFEN 600 MG PO TABS: 600.0000 mg | ORAL_TABLET | Freq: Four times a day (QID) | ORAL | 0 refills | Status: DC | PRN

## 2021-02-20 MED ORDER — AEROCHAMBER PLUS MISC: 2 refills | Status: DC

## 2021-02-20 MED ORDER — FLUTICASONE PROPIONATE 50 MCG/ACT NA SUSP: 2.0000 | Freq: Every day | NASAL | 0 refills | Status: DC

## 2021-02-20 MED ORDER — BENZONATATE 200 MG PO CAPS: 200.0000 mg | ORAL_CAPSULE | Freq: Three times a day (TID) | ORAL | 0 refills | Status: DC | PRN

## 2021-02-20 MED ORDER — HYDROCOD POLST-CPM POLST ER 10-8 MG/5ML PO SUER: 5.0000 mL | Freq: Two times a day (BID) | ORAL | 0 refills | Status: DC | PRN

## 2021-02-20 MED ORDER — ALBUTEROL SULFATE HFA 108 (90 BASE) MCG/ACT IN AERS: 1.0000 | INHALATION_SPRAY | RESPIRATORY_TRACT | 0 refills | Status: DC | PRN

## 2021-02-20 NOTE — ED Triage Notes (Signed)
Patient c/o cough, nasal congestion and runny nose that started 4 days ago. Patient reports fevers off and on.

## 2021-02-20 NOTE — ED Provider Notes (Addendum)
HPI  SUBJECTIVE:  Ian Pineda is a 35 y.o. male who presents with days of intermittent fevers, T-max 101.5 at the night in the evening, nasal congestion, yellowish-green rhinorrhea, deep cough productive of the same material as the nasal congestion, sinus pain and pressure.  He reports shortness of breath with coughing only.  He states that his 71-year-old had similar symptoms last week.  No body aches, headaches, facial swelling, upper dental pain.  No wheezing, chest pain, loss of sense of smell or taste, nausea no vomiting, diarrhea, abdominal pain.  He is unable to sleep at night secondary to the cough.  No antibiotics in the past month.  No antipyretic in the past 6 hours.  He has been taking Mucinex DM, Mucinex D X, severe cough and cold and NyQuil, Afrin, Tylenol 1000 mg and ibuprofen 400 mg.  Tylenol and ibuprofen help with the fevers.  Symptoms are worse with lying down.  He is an occasional smoker.  No history of pulmonary disease, frequent sinusitis, allergies, diabetes, hypertension.  [id number], Westley Hummer, MD   Past Medical History:  Diagnosis Date  . Back pain   . GERD (gastroesophageal reflux disease)   . Syncope    one episode worked up (cardiology and endocrinology).  felt to be related to hypoglycemia    Past Surgical History:  Procedure Laterality Date  . ANKLE SURGERY  2003  . LEG SURGERY    . WISDOM TOOTH EXTRACTION      Family History  Adopted: Yes    Social History   Tobacco Use  . Smoking status: Current Every Day Smoker    Types: Cigarettes  . Smokeless tobacco: Never Used  . Tobacco comment: socially   Vaping Use  . Vaping Use: Never used  Substance Use Topics  . Alcohol use: Yes    Alcohol/week: 0.0 standard drinks    Comment: socially  . Drug use: No    No current facility-administered medications for this encounter.  Current Outpatient Medications:  .  albuterol (VENTOLIN HFA) 108 (90 Base) MCG/ACT inhaler, Inhale 1-2 puffs into the lungs  every 4 (four) hours as needed for wheezing or shortness of breath., Disp: 1 each, Rfl: 0 .  benzonatate (TESSALON) 200 MG capsule, Take 1 capsule (200 mg total) by mouth 3 (three) times daily as needed for cough., Disp: 30 capsule, Rfl: 0 .  chlorpheniramine-HYDROcodone (TUSSIONEX PENNKINETIC ER) 10-8 MG/5ML SUER, Take 5 mLs by mouth every 12 (twelve) hours as needed for cough., Disp: 60 mL, Rfl: 0 .  esomeprazole (NEXIUM) 20 MG packet, Take 20 mg by mouth daily before breakfast., Disp: , Rfl:  .  fluticasone (FLONASE) 50 MCG/ACT nasal spray, Place 2 sprays into both nostrils daily., Disp: 16 g, Rfl: 0 .  ibuprofen (ADVIL) 600 MG tablet, Take 1 tablet (600 mg total) by mouth every 6 (six) hours as needed., Disp: 30 tablet, Rfl: 0 .  sildenafil (VIAGRA) 50 MG tablet, Take 1 tablet (50 mg total) by mouth daily as needed for erectile dysfunction., Disp: 30 tablet, Rfl: 0 .  Spacer/Aero-Holding Chambers (AEROCHAMBER PLUS) inhaler, Use with inhaler, Disp: 1 each, Rfl: 2  No Known Allergies   ROS  As noted in HPI.   Physical Exam  BP (!) 131/118 (BP Location: Left Arm)   Pulse (!) 106   Temp 98.5 F (36.9 C) (Oral)   Resp 16   Ht 6' (1.829 m)   Wt 120.2 kg   SpO2 100%   BMI 35.94 kg/m  Constitutional: Well developed, well nourished, no acute distress coughing. Eyes:  EOMI, conjunctiva normal bilaterally HENT: Normocephalic, atraumatic,mucus membranes moist.  Positive nasal congestion.  Erythematous, swollen turbinates.  No maxillary, frontal sinus tenderness.  Normal oropharynx.  Extensive postnasal drip. Respiratory: Normal inspiratory effort, rhonchi left lower lobe.  No wheezing, rales.  No anterior,  lateral chest wall tenderness. Cardiovascular: regular tachycardia, no murmurs rubs or gallop GI: nondistended skin: No rash, skin intact Musculoskeletal: no deformities Neurologic: Alert & oriented x 3, no focal neuro deficits Psychiatric: Speech and behavior appropriate   ED  Course   Medications - No data to display  Orders Placed This Encounter  Procedures  . SARS CORONAVIRUS 2 (TAT 6-24 HRS) Nasopharyngeal Nasopharyngeal Swab    Standing Status:   Standing    Number of Occurrences:   1    Order Specific Question:   Is this test for diagnosis or screening    Answer:   Diagnosis of ill patient    Order Specific Question:   Symptomatic for COVID-19 as defined by CDC    Answer:   Yes    Order Specific Question:   Date of Symptom Onset    Answer:   02/16/2021    Order Specific Question:   Hospitalized for COVID-19    Answer:   No    Order Specific Question:   Admitted to ICU for COVID-19    Answer:   No    Order Specific Question:   Previously tested for COVID-19    Answer:   Yes    Order Specific Question:   Resident in a congregate (group) care setting    Answer:   No    Order Specific Question:   Employed in healthcare setting    Answer:   No    Order Specific Question:   Has patient completed COVID vaccination(s) (2 doses of Pfizer/Moderna 1 dose of [employer])    Answer:   No  . DG Chest 2 View    Standing Status:   Standing    Number of Occurrences:   1    Order Specific Question:   Reason for Exam (SYMPTOM  OR DIAGNOSIS REQUIRED)    Answer:   cough    No results found for this or any previous visit (from the past 24 hour(s)). DG Chest 2 View  Result Date: 02/20/2021 CLINICAL DATA:  Cough. EXAM: CHEST - 2 VIEW COMPARISON:  None. FINDINGS: The heart size and mediastinal contours are within normal limits. Both lungs are clear. No visible pleural effusions or pneumothorax. No acute osseous abnormality. IMPRESSION: No active cardiopulmonary disease. Electronically Signed   By: Feliberto Harts MD   On: 02/20/2021 11:12    ED Clinical Impression  1. Upper respiratory tract infection, unspecified type   2. Encounter for laboratory testing for COVID-19 virus   3. Cough      ED Assessment/Plan  Suspect URI.  Will get chest x-ray because  of fever, cough and rhonchi.  If negative, will send home with supportive treatment with Tessalon for the cough during the day, Tussionex for the cough at night.  Tylenol/ibuprofen, and albuterol inhaler with a spacer, continue Mucinex DM or Mucinex D, saline nasal irrigation, Flonase.  COVID sent.  He has no sinus tenderness which would be suggestive of sinusitis, so patient was not prescribed antibiotics.  [place] Narcotic database reviewed for this patient, and feel that the risk/benefit ratio today is favorable for proceeding with a prescription for controlled substance.  Patient had 10 tramadol prescribed to him in 01/29/2021  Reviewed imaging independently.  No pneumonia.  See radiology report for full details.  Discussed labs, imaging, MDM, treatment plan, and plan for follow-up with patient. Discussed sn/sx that should prompt return to the ED. patient agrees with plan.   Addendum 02/23/2021 1111-Covid negative.  Meds ordered this encounter  Medications  . benzonatate (TESSALON) 200 MG capsule    Sig: Take 1 capsule (200 mg total) by mouth 3 (three) times daily as needed for cough.    Dispense:  30 capsule    Refill:  0  . ibuprofen (ADVIL) 600 MG tablet    Sig: Take 1 tablet (600 mg total) by mouth every 6 (six) hours as needed.    Dispense:  30 tablet    Refill:  0  . chlorpheniramine-HYDROcodone (TUSSIONEX PENNKINETIC ER) 10-8 MG/5ML SUER    Sig: Take 5 mLs by mouth every 12 (twelve) hours as needed for cough.    Dispense:  60 mL    Refill:  0  . albuterol (VENTOLIN HFA) 108 (90 Base) MCG/ACT inhaler    Sig: Inhale 1-2 puffs into the lungs every 4 (four) hours as needed for wheezing or shortness of breath.    Dispense:  1 each    Refill:  0  . Spacer/Aero-Holding Chambers (AEROCHAMBER PLUS) inhaler    Sig: Use with inhaler    Dispense:  1 each    Refill:  2    Please educate patient on use  . fluticasone (FLONASE) 50 MCG/ACT nasal spray    Sig: Place 2 sprays into both nostrils  daily.    Dispense:  16 g    Refill:  0      *This clinic note was created using Scientist, clinical (histocompatibility and immunogenetics). Therefore, there may be occasional mistakes despite careful proofreading.  ?    Domenick Gong, MD 02/20/21 Ian Pineda    Domenick Gong, MD 02/21/21 [phone]

## 2021-02-20 NOTE — Discharge Instructions (Signed)
Your chest x-ray was negative for pneumonia.  Supportive treatment for now-Tessalon for the cough during the day, Tussionex for the cough at night.  1000 mg of Tylenol combined with 600 mg of ibuprofen together 3 or 4 times a day as needed for pain, fever.  2 puffs from your albuterol inhaler using your spacer every 4-6 hours as needed for coughing, chest tightness.  Continue Mucinex DM or Mucinex D, start saline nasal irrigation with a Lloyd Huger Med rinse and distilled water as often as you want.  Flonase will also help with the nasal congestion and postnasal drip.  We will contact you if your Covid test comes back positive.  Follow-up here with your doctor if you are not better in 6 days or if you get worse and we can consider antibiotics at that time.

## 2021-05-04 ENCOUNTER — Encounter: Payer: Self-pay | Admitting: Internal Medicine

## 2021-05-21 ENCOUNTER — Ambulatory Visit: Payer: 59 | Admitting: Internal Medicine

## 2022-02-22 ENCOUNTER — Ambulatory Visit: Payer: 59 | Admitting: Family Medicine

## 2022-02-22 ENCOUNTER — Encounter: Payer: Self-pay | Admitting: Family Medicine

## 2022-02-22 VITALS — BP 120/80 | HR 106 | Temp 98.7°F | Ht 72.0 in | Wt 287.0 lb

## 2022-02-22 DIAGNOSIS — K625 Hemorrhage of anus and rectum: Secondary | ICD-10-CM

## 2022-02-22 DIAGNOSIS — K6289 Other specified diseases of anus and rectum: Secondary | ICD-10-CM

## 2022-02-22 DIAGNOSIS — K649 Unspecified hemorrhoids: Secondary | ICD-10-CM

## 2022-02-22 NOTE — Patient Instructions (Signed)
Nice to see you. ?GI will call you for an appointment. If you do not hear from them in the next week please let us know.  ?

## 2022-02-23 DIAGNOSIS — K6289 Other specified diseases of anus and rectum: Secondary | ICD-10-CM | POA: Insufficient documentation

## 2022-02-23 NOTE — Progress Notes (Signed)
?Marikay Alar, MD ?Phone: [phone] ? ?Ian Pineda is a 36 y.o. male who presents today for same-day visit. ? ?Rectal pain: Patient notes this started about a month ago.  His stools were very hard and he had to strain significantly to pass them.  He started having painful bowel movements with this.  He noted a few drops of blood with this and also some blood when wiping.  He notes the discomfort got worse the longer he had to strain so he started on Colace.  He notes that has helped his bowel movements become softer and easier to pass.  He notes more recently there was a pulsating pain at his rectum that was significantly uncomfortable and swollen.  He started Preparation H which helped significantly and reduced his pain to a 3-4/10.  He notes no family history of colon cancer.  He notes some nausea with this though no vomiting or abdominal pain.  He reports a colonoscopy 6 to 8 years ago related to prior rectal bleeding.  The patient reports he is very anxious about this visit. ? ?Social History  ? ?Tobacco Use  ?Smoking Status Every Day  ? Types: Cigarettes  ?Smokeless Tobacco Never  ?Tobacco Comments  ? socially   ? ? ?Current Outpatient Medications on File Prior to Visit  ?Medication Sig Dispense Refill  ? esomeprazole (NEXIUM) 20 MG packet Take 20 mg by mouth daily before breakfast.    ? sildenafil (VIAGRA) 50 MG tablet Take 1 tablet (50 mg total) by mouth daily as needed for erectile dysfunction. 30 tablet 0  ? ?No current facility-administered medications on file prior to visit.  ? ? ? ?ROS see history of present illness ? ?Objective ? ?Physical Exam ?Vitals:  ? 02/22/22 1518 02/22/22 1602  ?BP: 120/80   ?Pulse: (!) 122 (!) 106  ?Temp: 98.7 ?F (37.1 ?C)   ?SpO2: 99%   ? ? ?BP Readings from Last 3 Encounters:  ?02/22/22 120/80  ?02/20/21 (!) 131/118  ?01/29/21 130/82  ? ?Wt Readings from Last 3 Encounters:  ?02/22/22 287 lb (130.2 kg)  ?02/20/21 265 lb (120.2 kg)  ?01/29/21 273 lb (123.8 kg)   ? ? ?Physical Exam ?Constitutional:   ?   General: He is not in acute distress. ?   Appearance: He is not diaphoretic.  ?Cardiovascular:  ?   Rate and Rhythm: Regular rhythm. Tachycardia present.  ?   Heart sounds: Normal heart sounds.  ?Pulmonary:  ?   Effort: Pulmonary effort is normal.  ?Genitourinary: ? ? ?Skin: ?   General: Skin is warm and dry.  ?Neurological:  ?   Mental Status: He is alert.  ? ? ? ?Assessment/Plan: Please see individual problem list. ? ?Problem List Items Addressed This Visit   ? ? Rectal bleeding  ?  Patient does have evidence of a small hemorrhoid that is nonthrombosed and not irritated.  He had a significant amount of discomfort when I started to do the rectal exam so that was aborted.  He may have a fissure.  Referral placed to GI for further evaluation and treatment of this issue.  Discussed switching to MiraLAX once daily to help keep his bowel movements soft so he does not have to strain.  He can continue over-the-counter Preparation H.  If he gets back to being constipated again he can add in Colace.  If his swelling and pain increase again he will let me know.  Tachycardia is likely related to his anxiety surrounding this visit. ?  ?  ?  Rectal pain - Primary  ? Relevant Orders  ? Ambulatory referral to Gastroenterology  ? ?Other Visit Diagnoses   ? ? Hemorrhoids, unspecified hemorrhoid type      ? Relevant Orders  ? Ambulatory referral to Gastroenterology  ? BRBPR (bright red blood per rectum)      ? Relevant Orders  ? Ambulatory referral to Gastroenterology  ? ?  ? ? ? ?Return if symptoms worsen or fail to improve. ? ?This visit occurred during the SARS-CoV-2 public health emergency.  Safety protocols were in place, including screening questions prior to the visit, additional usage of staff PPE, and extensive cleaning of exam room while observing appropriate contact time as indicated for disinfecting solutions.  ? ?I have spent 32 minutes in the care of this patient regarding  history taking, completion of exam, discussion of possible causes of his symptoms, counseling on treatment options, placing orders, discussion of GI referral. ? ? ?Marikay Alar, MD ?[facility] [place] ? ?

## 2022-02-23 NOTE — Assessment & Plan Note (Addendum)
Patient does have evidence of a small hemorrhoid that is nonthrombosed and not irritated.  He had a significant amount of discomfort when I started to do the rectal exam so that was aborted.  He may have a fissure.  Referral placed to GI for further evaluation and treatment of this issue.  Discussed switching to MiraLAX once daily to help keep his bowel movements soft so he does not have to strain.  He can continue over-the-counter Preparation H.  If he gets back to being constipated again he can add in Colace.  If his swelling and pain increase again he will let me know.  Tachycardia is likely related to his anxiety surrounding this visit.  It improved to near normal as he relaxed at the end of the visit. ?

## 2022-03-29 ENCOUNTER — Telehealth: Payer: Self-pay | Admitting: Internal Medicine

## 2022-03-29 DIAGNOSIS — K6289 Other specified diseases of anus and rectum: Secondary | ICD-10-CM

## 2022-03-29 NOTE — Telephone Encounter (Signed)
I put a new referral for [facility]. I'm not sure how soon they will be able to see him though we can see if it is sooner than the [facility] appointment.  ?

## 2022-03-29 NOTE — Telephone Encounter (Signed)
Pt called stating that his issue is getting worse and he want to make sure that it will not take a long time for this referral ?

## 2022-03-29 NOTE — Telephone Encounter (Signed)
Lm for pt to cb.

## 2022-03-29 NOTE — Addendum Note (Signed)
Addended by: Birdie Sons Annalynne Ibanez G on: 03/29/2022 10:07 AM ? ? Modules accepted: Orders ? ?

## 2022-03-29 NOTE — Telephone Encounter (Signed)
I called and spoke with the patient and informed him that a new referral was put in for [facility] and he understood.  Oveda Dadamo,cma  ?

## 2022-03-29 NOTE — Telephone Encounter (Addendum)
Pt called in stating that he was referred to GI... Pt stated that the appt is to far out for him.... Pt stated that he needs an appt that is closer... Pt was wondering if there is another place he can be referred to... Pt requesting callback  ?

## 2022-07-22 ENCOUNTER — Ambulatory Visit: Payer: 59 | Admitting: Gastroenterology

## 2022-08-26 IMAGING — CR DG CHEST 2V
3 series · 3 of 3 positions shown · non-contrast
Comparison: None.

CLINICAL DATA: Cough.

EXAM:
CHEST - 2 VIEW

[chest pa (1 of 2)]
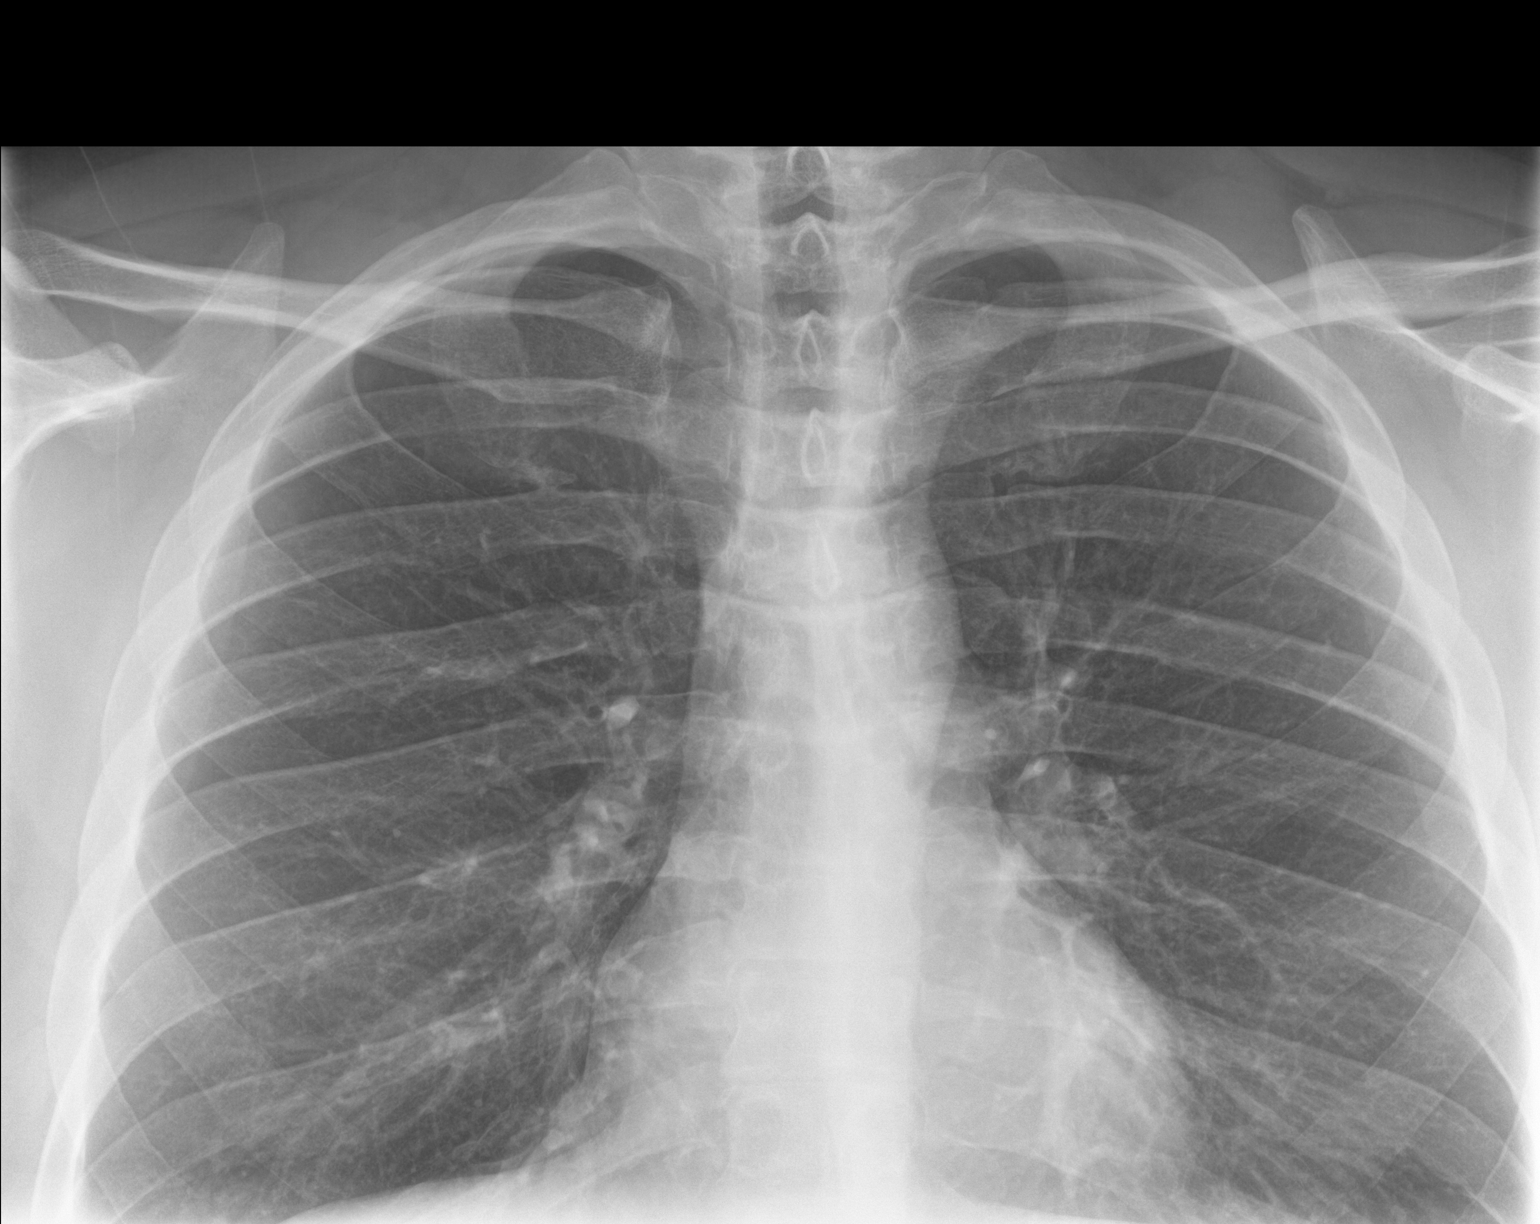

[chest lat]
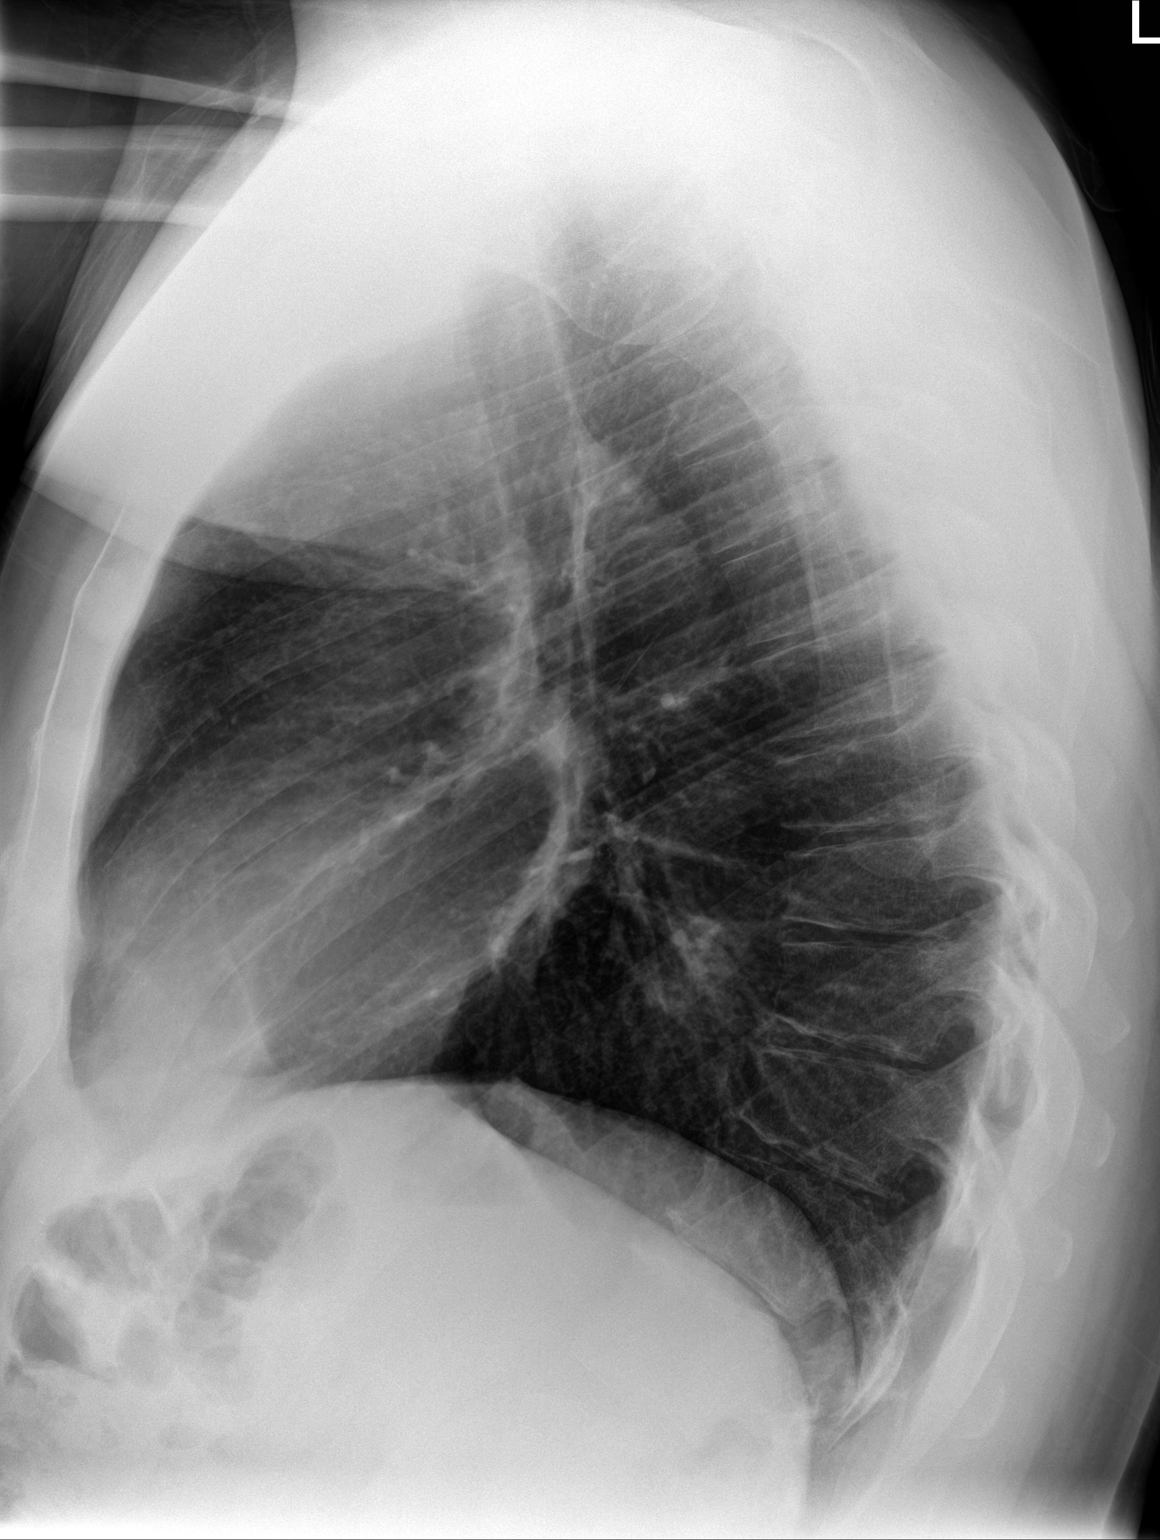

[chest pa (2 of 2)]
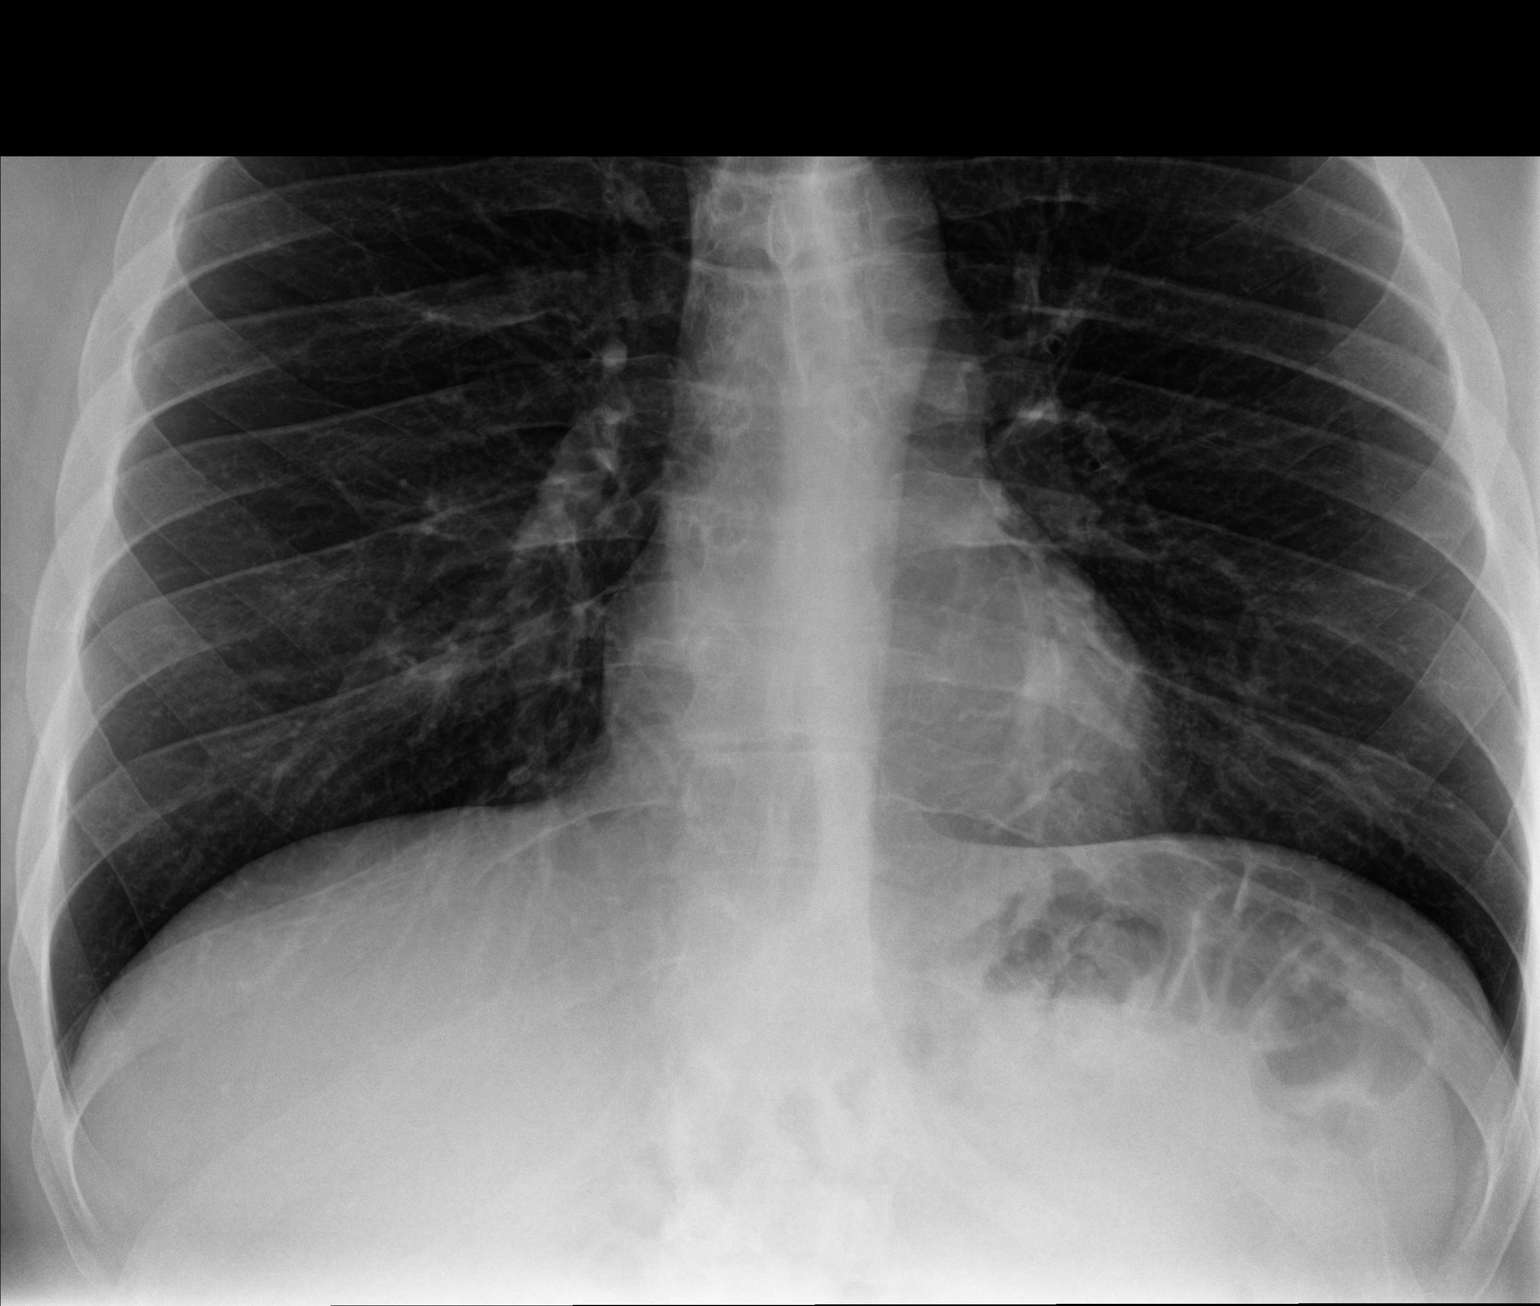

[3 of 3 positions shown; findings below may reference images not displayed]

FINDINGS: The heart size and mediastinal contours are within normal limits.
Both lungs are clear. No visible pleural effusions or pneumothorax.
No acute osseous abnormality.
IMPRESSION: No active cardiopulmonary disease.

## 2022-12-29 ENCOUNTER — Telehealth: Payer: Self-pay | Admitting: Internal Medicine

## 2022-12-29 NOTE — Telephone Encounter (Signed)
Agree.  If persistent symptoms after two rounds of abx, needs to be evaluated.  If persistent symptoms despite abx, I can place referral to ENT for evaluation - given recurrence.  Needs to reestablish care.

## 2022-12-29 NOTE — Telephone Encounter (Signed)
Patient has been seen by other providers for acute visits but has not seen you since 2020. Requesting something for an ear infection because he has done two rounds of amoxicillin and has not cleared. Abx does help but does not clear. As soon as he stops the abx symptoms return. Advised that since he has not been seen since 2020 appt is needed in order for treatment. Patient stated he works 8-4:30 which is why he has been going to urgent care after hours. Still advised that appt is needed.

## 2022-12-29 NOTE — Telephone Encounter (Signed)
Patient called and said he has been to urgent care 2x in a month for ear infection. He states that urgent care gave him amoxicillin 2 times, not clearing up. Patient was offered an appointment at office and he stated that Dr Nicki Reaper said if he ever ran into an issue to call her.

## 2022-12-29 NOTE — Telephone Encounter (Signed)
Patient aware of below. Hold on ENT referral for now. Scheduled for re-establish visit next week. Going to acute care for his ear. Will f/u at appt.

## 2023-01-06 ENCOUNTER — Ambulatory Visit: Payer: 59 | Admitting: Internal Medicine

## 2023-01-06 ENCOUNTER — Encounter: Payer: Self-pay | Admitting: Internal Medicine

## 2023-01-06 VITALS — BP 130/80 | HR 100 | Temp 98.0°F | Resp 16 | Ht 72.0 in | Wt 284.0 lb

## 2023-01-06 DIAGNOSIS — K602 Anal fissure, unspecified: Secondary | ICD-10-CM | POA: Diagnosis not present

## 2023-01-06 DIAGNOSIS — K625 Hemorrhage of anus and rectum: Secondary | ICD-10-CM

## 2023-01-06 DIAGNOSIS — Z13220 Encounter for screening for lipoid disorders: Secondary | ICD-10-CM

## 2023-01-06 DIAGNOSIS — F439 Reaction to severe stress, unspecified: Secondary | ICD-10-CM | POA: Diagnosis not present

## 2023-01-06 DIAGNOSIS — H938X9 Other specified disorders of ear, unspecified ear: Secondary | ICD-10-CM

## 2023-01-06 LAB — CBC WITH DIFFERENTIAL/PLATELET
Basophils Absolute: 0.1 10*3/uL (ref 0.0–0.1)
Basophils Relative: 0.8 % (ref 0.0–3.0)
Eosinophils Absolute: 0.2 10*3/uL (ref 0.0–0.7)
Eosinophils Relative: 1.9 % (ref 0.0–5.0)
HCT: 47.1 % (ref 39.0–52.0)
Hemoglobin: 15.9 g/dL (ref 13.0–17.0)
Lymphocytes Relative: 15.5 % (ref 12.0–46.0)
Lymphs Abs: 1.8 10*3/uL (ref 0.7–4.0)
MCHC: 33.8 g/dL (ref 30.0–36.0)
MCV: 86.8 fl (ref 78.0–100.0)
Monocytes Absolute: 0.8 10*3/uL (ref 0.1–1.0)
Monocytes Relative: 6.7 % (ref 3.0–12.0)
Neutro Abs: 8.8 10*3/uL — ABNORMAL HIGH (ref 1.4–7.7)
Neutrophils Relative %: 75.1 % (ref 43.0–77.0)
Platelets: 260 10*3/uL (ref 150.0–400.0)
RBC: 5.43 Mil/uL (ref 4.22–5.81)
RDW: 13.5 % (ref 11.5–15.5)
WBC: 11.6 10*3/uL — ABNORMAL HIGH (ref 4.0–10.5)

## 2023-01-06 LAB — BASIC METABOLIC PANEL
BUN: 14 mg/dL (ref 6–23)
CO2: 26 mEq/L (ref 19–32)
Calcium: 9.8 mg/dL (ref 8.4–10.5)
Chloride: 103 mEq/L (ref 96–112)
Creatinine, Ser: 0.94 mg/dL (ref 0.40–1.50)
GFR: 105.63 mL/min (ref >60.00)
Glucose, Bld: 96 mg/dL (ref 70–99)
Potassium: 4.3 mEq/L (ref 3.5–5.1)
Sodium: 139 mEq/L (ref 135–145)

## 2023-01-06 LAB — LIPID PANEL
Cholesterol: 143 mg/dL (ref 0–200)
HDL: 53.9 mg/dL (ref >39.00)
LDL Cholesterol: 64 mg/dL (ref 0–99)
NonHDL: 89.31
Total CHOL/HDL Ratio: 3
Triglycerides: 129 mg/dL (ref 0.0–149.0)
VLDL: 25.8 mg/dL (ref 0.0–40.0)

## 2023-01-06 LAB — HEPATIC FUNCTION PANEL
ALT: 41 U/L (ref 0–53)
AST: 28 U/L (ref 0–37)
Albumin: 4.7 g/dL (ref 3.5–5.2)
Alkaline Phosphatase: 77 U/L (ref 39–117)
Bilirubin, Direct: 0.1 mg/dL (ref 0.0–0.3)
Total Bilirubin: 0.6 mg/dL (ref 0.2–1.2)
Total Protein: 7.3 g/dL (ref 6.0–8.3)

## 2023-01-06 LAB — TSH: TSH: 1.07 u[IU]/mL (ref 0.35–5.50)

## 2023-01-06 MED ORDER — CEFDINIR 300 MG PO CAPS: 300.0000 mg | ORAL_CAPSULE | Freq: Two times a day (BID) | ORAL | 0 refills | Status: DC

## 2023-01-06 NOTE — Progress Notes (Signed)
Subjective:    Patient ID: Ian Pineda, male    DOB: July 15, 1986, 37 y.o.   MRN: [id number]  Patient here for  Chief Complaint  Patient presents with   Establish Care    Re establish care    HPI Here to reestablish care.  I have not seen him since 11/2018.  States that he has had persistent intermittent issues with congestion, fever.  To urgent care - diagnosed with cold.  Took mucinex.  Two weeks later - lost hearing.  Increased pain - ear.  Brown drainage on pillow.  Diagnosed with ear infection.  Treated with abx.  Better.  Day 5-6 - popped. Hearing improved.  Last week, increased nasal congestion.  Sore throat  am.  Increased drainage.  Chest congestion.  Some cough.  Some acid reflux.  Yawning/swallowing - pops.  Feels like something blocking.  Discussed ENT evaluation.  Also, evaluated by GI rectal pain and bleeding.  S/p flex sig 05/2022 - pathology - unremarkable.  Anal fissure.  Recommended miralax daily and colace bid and fiber. Also compounded cream.  Is doing better.  No chest pain or sob.  No vomiting.    Past Medical History:  Diagnosis Date   Back pain    GERD (gastroesophageal reflux disease)    Syncope    one episode worked up (cardiology and endocrinology).  felt to be related to hypoglycemia   Past Surgical History:  Procedure Laterality Date   ANKLE SURGERY  2003   LEG SURGERY     WISDOM TOOTH EXTRACTION     Family History  Adopted: Yes   Social History   Socioeconomic History   Marital status: Married    Spouse name: Financial controller   Number of children: 0   Years of education: 14   Highest education level: Not on file  Occupational History   Occupation: Engineer, site  Tobacco Use   Smoking status: Every Day    Types: Cigarettes   Smokeless tobacco: Never   Tobacco comments:    socially   Vaping Use   Vaping Use: Never used  Substance and Sexual Activity   Alcohol use: Yes    Alcohol/week: 0.0 standard drinks of alcohol    Comment: socially    Drug use: No   Sexual activity: Yes  Other Topics Concern   Not on file  Social History Narrative   Not on file   Social Determinants of Health   Financial Resource Strain: Not on file  Food Insecurity: Not on file  Transportation Needs: Not on file  Physical Activity: Not on file  Stress: Not on file  Social Connections: Not on file     Review of Systems  Constitutional:  Negative for appetite change and unexpected weight change.  HENT:         Congestion and ear issues as outlined.    Respiratory:  Negative for chest tightness and shortness of breath.        No increased cough.   Cardiovascular:  Negative for chest pain and palpitations.  Gastrointestinal:  Negative for abdominal pain, nausea and vomiting.  Genitourinary:  Negative for difficulty urinating and dysuria.  Musculoskeletal:  Negative for joint swelling and myalgias.  Skin:  Negative for color change and rash.  Neurological:  Negative for dizziness and headaches.  Psychiatric/Behavioral:  Negative for agitation and dysphoric mood.        Objective:     BP 130/80   Pulse 100   Temp 98  F (36.7 C)   Resp 16   Ht 6' (1.829 m)   Wt 284 lb (128.8 kg)   SpO2 98%   BMI 38.52 kg/m  Wt Readings from Last 3 Encounters:  01/06/23 284 lb (128.8 kg)  02/22/22 287 lb (130.2 kg)  02/20/21 265 lb (120.2 kg)    Physical Exam Vitals reviewed.  Constitutional:      General: He is not in acute distress.    Appearance: Normal appearance. He is well-developed.  HENT:     Head: Normocephalic and atraumatic.     Right Ear: Ear canal and external ear normal.     Left Ear: Ear canal and external ear normal.     Ears:     Comments: TM - no erythema.  Eyes:     General: No scleral icterus.       Right eye: No discharge.        Left eye: No discharge.     Conjunctiva/sclera: Conjunctivae normal.  Cardiovascular:     Rate and Rhythm: Normal rate and regular rhythm.  Pulmonary:     Effort: Pulmonary effort is  normal. No respiratory distress.     Breath sounds: Normal breath sounds.  Abdominal:     General: Bowel sounds are normal.     Palpations: Abdomen is soft.     Tenderness: There is no abdominal tenderness.  Musculoskeletal:        General: No swelling or tenderness.     Cervical back: Neck supple. No tenderness.  Lymphadenopathy:     Cervical: No cervical adenopathy.  Skin:    Findings: No erythema or rash.  Neurological:     Mental Status: He is alert.  Psychiatric:        Mood and Affect: Mood normal.        Behavior: Behavior normal.      Outpatient Encounter Medications as of 01/06/2023  Medication Sig   cefdinir (OMNICEF) 300 MG capsule Take 1 capsule (300 mg total) by mouth 2 (two) times daily.   esomeprazole (NEXIUM) 20 MG packet Take 20 mg by mouth daily before breakfast.   sildenafil (VIAGRA) 50 MG tablet Take 1 tablet (50 mg total) by mouth daily as needed for erectile dysfunction.   No facility-administered encounter medications on file as of 01/06/2023.     Lab Results  Component Value Date   WBC 11.6 (H) 01/06/2023   HGB 15.9 01/06/2023   HCT 47.1 01/06/2023   PLT 260.0 01/06/2023   GLUCOSE 96 01/06/2023   CHOL 143 01/06/2023   TRIG 129.0 01/06/2023   HDL 53.90 01/06/2023   LDLCALC 64 01/06/2023   ALT 41 01/06/2023   AST 28 01/06/2023   NA 139 01/06/2023   K 4.3 01/06/2023   CL 103 01/06/2023   CREATININE 0.94 01/06/2023   BUN 14 01/06/2023   CO2 26 01/06/2023   TSH 1.07 01/06/2023   HGBA1C 5.5 01/29/2016    DG Chest 2 View  Result Date: 02/20/2021 CLINICAL DATA:  Cough. EXAM: CHEST - 2 VIEW COMPARISON:  None. FINDINGS: The heart size and mediastinal contours are within normal limits. Both lungs are clear. No visible pleural effusions or pneumothorax. No acute osseous abnormality. IMPRESSION: No active cardiopulmonary disease. Electronically Signed   By: Margaretha Sheffield MD   On: 02/20/2021 11:12       Assessment & Plan:  Anal  fissure Assessment & Plan: Seeing GI.  Recent flex sig as outlined.  Colace and compound cream.  Follow.  Rectal bleeding -     CBC with Differential/Platelet -     Hepatic function panel -     Basic metabolic panel -     TSH  Screening cholesterol level -     Lipid panel  Stress Assessment & Plan: Overall appears to be handling things relatively well.  Follow.     Sensation of fullness in ear, unspecified laterality Assessment & Plan: Has had the persistent intermittent issues as outlined.  Recent increased pain and decreased hearing.  This has improved.  No increased pain currently.  Feels like something "blocking".  Will continue mucinex.  Afrin nasal spray and steroid nasal spray as directed.  Omnicef.  Discussed ENT evaluation.  Call with update.     Other orders -     Cefdinir; Take 1 capsule (300 mg total) by mouth 2 (two) times daily.  Dispense: 20 capsule; Refill: 0     Einar Pheasant, MD

## 2023-01-06 NOTE — Patient Instructions (Signed)
Afrin nasal spray - 2 sprays each nostril 2x/day for three days only and then stop  Nasacort nasal spray - 2 sprays each nostril one time per day.  Do this in the evening.    Can continue mucinex.

## 2023-01-07 ENCOUNTER — Other Ambulatory Visit: Payer: Self-pay

## 2023-01-07 DIAGNOSIS — D72829 Elevated white blood cell count, unspecified: Secondary | ICD-10-CM

## 2023-01-16 ENCOUNTER — Encounter: Payer: Self-pay | Admitting: Internal Medicine

## 2023-01-16 DIAGNOSIS — H938X9 Other specified disorders of ear, unspecified ear: Secondary | ICD-10-CM | POA: Insufficient documentation

## 2023-01-16 NOTE — Assessment & Plan Note (Signed)
Seeing GI.  Recent flex sig as outlined.  Colace and compound cream.  Follow.

## 2023-01-16 NOTE — Assessment & Plan Note (Signed)
Has had the persistent intermittent issues as outlined.  Recent increased pain and decreased hearing.  This has improved.  No increased pain currently.  Feels like something "blocking".  Will continue mucinex.  Afrin nasal spray and steroid nasal spray as directed.  Omnicef.  Discussed ENT evaluation.  Call with update.

## 2023-01-16 NOTE — Assessment & Plan Note (Signed)
Overall appears to be handling things relatively well.  Follow.   

## 2023-02-08 ENCOUNTER — Other Ambulatory Visit (INDEPENDENT_AMBULATORY_CARE_PROVIDER_SITE_OTHER): Payer: 59

## 2023-02-08 DIAGNOSIS — D72829 Elevated white blood cell count, unspecified: Secondary | ICD-10-CM | POA: Diagnosis not present

## 2023-02-08 LAB — CBC WITH DIFFERENTIAL/PLATELET
Basophils Absolute: 0.1 10*3/uL (ref 0.0–0.1)
Basophils Relative: 0.9 % (ref 0.0–3.0)
Eosinophils Absolute: 0.2 10*3/uL (ref 0.0–0.7)
Eosinophils Relative: 2.3 % (ref 0.0–5.0)
HCT: 46.4 % (ref 39.0–52.0)
Hemoglobin: 15.9 g/dL (ref 13.0–17.0)
Lymphocytes Relative: 16.9 % (ref 12.0–46.0)
Lymphs Abs: 1.5 10*3/uL (ref 0.7–4.0)
MCHC: 34.3 g/dL (ref 30.0–36.0)
MCV: 86.2 fl (ref 78.0–100.0)
Monocytes Absolute: 0.7 10*3/uL (ref 0.1–1.0)
Monocytes Relative: 7.6 % (ref 3.0–12.0)
Neutro Abs: 6.4 10*3/uL (ref 1.4–7.7)
Neutrophils Relative %: 72.3 % (ref 43.0–77.0)
Platelets: 250 10*3/uL (ref 150.0–400.0)
RBC: 5.38 Mil/uL (ref 4.22–5.81)
RDW: 13.3 % (ref 11.5–15.5)
WBC: 8.8 10*3/uL (ref 4.0–10.5)

## 2023-04-07 ENCOUNTER — Ambulatory Visit (INDEPENDENT_AMBULATORY_CARE_PROVIDER_SITE_OTHER): Payer: 59 | Admitting: Internal Medicine

## 2023-04-07 ENCOUNTER — Encounter: Payer: Self-pay | Admitting: Internal Medicine

## 2023-04-07 VITALS — BP 122/90 | HR 88 | Temp 97.8°F | Resp 17 | Ht 72.0 in | Wt 282.0 lb

## 2023-04-07 DIAGNOSIS — K602 Anal fissure, unspecified: Secondary | ICD-10-CM

## 2023-04-07 DIAGNOSIS — G479 Sleep disorder, unspecified: Secondary | ICD-10-CM | POA: Diagnosis not present

## 2023-04-07 DIAGNOSIS — F439 Reaction to severe stress, unspecified: Secondary | ICD-10-CM

## 2023-04-07 DIAGNOSIS — H938X9 Other specified disorders of ear, unspecified ear: Secondary | ICD-10-CM

## 2023-04-07 DIAGNOSIS — K219 Gastro-esophageal reflux disease without esophagitis: Secondary | ICD-10-CM

## 2023-04-07 DIAGNOSIS — Z0000 Encounter for general adult medical examination without abnormal findings: Secondary | ICD-10-CM

## 2023-04-07 DIAGNOSIS — N528 Other male erectile dysfunction: Secondary | ICD-10-CM

## 2023-04-07 NOTE — Progress Notes (Signed)
Subjective:    Patient ID: Ian Pineda, male    DOB: 1986/01/11, 37 y.o.   MRN: [id number]  Patient here for  Chief Complaint  Patient presents with   Annual Exam    HPI Here for his physical exam. Doing relatively well. Previously evaluated by GI rectal pain and bleeding.  S/p flex sig 05/2022 - pathology - unremarkable.  Anal fissure.  Recommended miralax daily and colace bid and fiber. Also compounded cream. Much improved.  Still taking colace to keep bowels moving.  No pain.  No bleeding. Previous ear fullness.  Treated - omnicef, afrin and steroid nasal spray.  Symptoms have resolved.  No sinus pressure or ear fullness.  No cough or congestion.  Does report some increased fatigue.  Sleep issues.  Does not feel rested when wakes up.    Past Medical History:  Diagnosis Date   Back pain    GERD (gastroesophageal reflux disease)    Syncope    one episode worked up (cardiology and endocrinology).  felt to be related to hypoglycemia   Past Surgical History:  Procedure Laterality Date   ANKLE SURGERY  2003   LEG SURGERY     WISDOM TOOTH EXTRACTION     Family History  Adopted: Yes   Social History   Socioeconomic History   Marital status: Married    Spouse name: Primary school teacher   Number of children: 0   Years of education: 14   Highest education level: Not on file  Occupational History   Occupation: Associate Professor  Tobacco Use   Smoking status: Every Day    Types: Cigarettes   Smokeless tobacco: Never   Tobacco comments:    socially   Vaping Use   Vaping Use: Never used  Substance and Sexual Activity   Alcohol use: Yes    Alcohol/week: 0.0 standard drinks of alcohol    Comment: socially   Drug use: No   Sexual activity: Yes  Other Topics Concern   Not on file  Social History Narrative   Not on file   Social Determinants of Health   Financial Resource Strain: Not on file  Food Insecurity: Not on file  Transportation Needs: Not on file  Physical Activity:  Not on file  Stress: Not on file  Social Connections: Not on file     Review of Systems  Constitutional:  Positive for fatigue. Negative for appetite change and unexpected weight change.  HENT:  Negative for congestion, sinus pressure and sore throat.   Eyes:  Negative for pain and visual disturbance.  Respiratory:  Negative for cough, chest tightness and shortness of breath.   Cardiovascular:  Negative for chest pain and palpitations.  Gastrointestinal:  Negative for abdominal pain, diarrhea, nausea and vomiting.  Genitourinary:  Negative for difficulty urinating and dysuria.  Musculoskeletal:  Negative for joint swelling and myalgias.  Skin:  Negative for color change and rash.  Neurological:  Negative for dizziness and headaches.  Hematological:  Negative for adenopathy. Does not bruise/bleed easily.  Psychiatric/Behavioral:  Negative for agitation and dysphoric mood.        Objective:     BP (!) 122/90 (BP Location: Left Arm, Patient Position: Sitting, Cuff Size: Large)   Pulse 88   Temp 97.8 F (36.6 C) (Oral)   Resp 17   Ht 6' (1.829 m)   Wt 282 lb (127.9 kg)   SpO2 99%   BMI 38.25 kg/m  Wt Readings from Last 3 Encounters:  04/07/23  282 lb (127.9 kg)  01/06/23 284 lb (128.8 kg)  02/22/22 287 lb (130.2 kg)    Physical Exam Constitutional:      General: He is not in acute distress.    Appearance: Normal appearance. He is well-developed.  HENT:     Head: Normocephalic and atraumatic.     Right Ear: External ear normal.     Left Ear: External ear normal.  Eyes:     General: No scleral icterus.       Right eye: No discharge.        Left eye: No discharge.     Conjunctiva/sclera: Conjunctivae normal.  Neck:     Thyroid: No thyromegaly.  Cardiovascular:     Rate and Rhythm: Normal rate and regular rhythm.  Pulmonary:     Effort: No respiratory distress.     Breath sounds: Normal breath sounds. No wheezing.  Abdominal:     General: Bowel sounds are normal.      Palpations: Abdomen is soft.     Tenderness: There is no abdominal tenderness.  Musculoskeletal:        General: No swelling or tenderness.     Cervical back: Neck supple. No tenderness.  Lymphadenopathy:     Cervical: No cervical adenopathy.  Skin:    Findings: No erythema or rash.  Neurological:     Mental Status: He is alert and oriented to person, place, and time.  Psychiatric:        Mood and Affect: Mood normal.        Behavior: Behavior normal.      Outpatient Encounter Medications as of 04/07/2023  Medication Sig   esomeprazole (NEXIUM) 20 MG packet Take 20 mg by mouth daily before breakfast.   sildenafil (VIAGRA) 50 MG tablet Take 1 tablet (50 mg total) by mouth daily as needed for erectile dysfunction.   [DISCONTINUED] cefdinir (OMNICEF) 300 MG capsule Take 1 capsule (300 mg total) by mouth 2 (two) times daily.   No facility-administered encounter medications on file as of 04/07/2023.     Lab Results  Component Value Date   WBC 8.8 02/08/2023   HGB 15.9 02/08/2023   HCT 46.4 02/08/2023   PLT 250.0 02/08/2023   GLUCOSE 96 01/06/2023   CHOL 143 01/06/2023   TRIG 129.0 01/06/2023   HDL 53.90 01/06/2023   LDLCALC 64 01/06/2023   ALT 41 01/06/2023   AST 28 01/06/2023   NA 139 01/06/2023   K 4.3 01/06/2023   CL 103 01/06/2023   CREATININE 0.94 01/06/2023   BUN 14 01/06/2023   CO2 26 01/06/2023   TSH 1.07 01/06/2023   HGBA1C 5.5 01/29/2016    DG Chest 2 View  Result Date: 02/20/2021 CLINICAL DATA:  Cough. EXAM: CHEST - 2 VIEW COMPARISON:  None. FINDINGS: The heart size and mediastinal contours are within normal limits. Both lungs are clear. No visible pleural effusions or pneumothorax. No acute osseous abnormality. IMPRESSION: No active cardiopulmonary disease. Electronically Signed   By: Feliberto Harts MD   On: 02/20/2021 11:12       Assessment & Plan:  Routine general medical examination at a health care facility  Health care  maintenance Assessment & Plan: Physical today  04/07/23. Colonoscopy 05/23/22 - negative.  Recommended repeat age 71.     Sleep difficulties Assessment & Plan: Increased fatigue.  Not feeling rested when wakes up.  Discussed concern regarding sleep apnea.  Agreeable to referral to pulmonary for evaluation for possible sleep apnea.  Orders: -     Ambulatory referral to Pulmonology  Anal fissure Assessment & Plan: Saw GI.  Recent flex sig as outlined.  Instructed on colace and compound cream.  Is better.  Continuing colace to keep bowels moving. Follow.    Sensation of fullness in ear, unspecified laterality Assessment & Plan: Resolved.    Other male erectile dysfunction Assessment & Plan: viagra as needed. Follow.  Has had extensive w/up.    Gastroesophageal reflux disease, unspecified whether esophagitis present Assessment & Plan: Controlled on current regimen.  Follow.     Stress Assessment & Plan: Overall appears to be handling things relatively well.  Follow.        Dale Fawn [place], MD

## 2023-04-07 NOTE — Assessment & Plan Note (Addendum)
Physical today  04/07/23. Colonoscopy 05/23/22 - negative.  Recommended repeat age 37.

## 2023-04-17 ENCOUNTER — Encounter: Payer: Self-pay | Admitting: Internal Medicine

## 2023-04-17 NOTE — Assessment & Plan Note (Signed)
Overall appears to be handling things relatively well.  Follow.   

## 2023-04-17 NOTE — Assessment & Plan Note (Signed)
Resolved

## 2023-04-17 NOTE — Assessment & Plan Note (Signed)
Controlled on current regimen.  Follow.  

## 2023-04-17 NOTE — Assessment & Plan Note (Signed)
Increased fatigue.  Not feeling rested when wakes up.  Discussed concern regarding sleep apnea.  Agreeable to referral to pulmonary for evaluation for possible sleep apnea.

## 2023-04-17 NOTE — Assessment & Plan Note (Signed)
viagra as needed. Follow.  Has had extensive w/up.  

## 2023-04-17 NOTE — Assessment & Plan Note (Signed)
Saw GI.  Recent flex sig as outlined.  Instructed on colace and compound cream.  Is better.  Continuing colace to keep bowels moving. Follow.

## 2023-06-07 ENCOUNTER — Telehealth: Payer: Self-pay

## 2023-06-08 NOTE — Telephone Encounter (Signed)
Error

## 2023-06-15 ENCOUNTER — Ambulatory Visit (INDEPENDENT_AMBULATORY_CARE_PROVIDER_SITE_OTHER): Payer: 59 | Admitting: Nurse Practitioner

## 2023-06-15 ENCOUNTER — Encounter: Payer: Self-pay | Admitting: Nurse Practitioner

## 2023-06-15 VITALS — BP 128/78 | HR 101 | Temp 98.3°F | Ht 72.0 in | Wt 285.0 lb

## 2023-06-15 DIAGNOSIS — R0681 Apnea, not elsewhere classified: Secondary | ICD-10-CM

## 2023-06-15 DIAGNOSIS — E6609 Other obesity due to excess calories: Secondary | ICD-10-CM

## 2023-06-15 DIAGNOSIS — R0683 Snoring: Secondary | ICD-10-CM | POA: Diagnosis not present

## 2023-06-15 DIAGNOSIS — F5104 Psychophysiologic insomnia: Secondary | ICD-10-CM

## 2023-06-15 DIAGNOSIS — Z6838 Body mass index (BMI) 38.0-38.9, adult: Secondary | ICD-10-CM

## 2023-06-15 DIAGNOSIS — G4719 Other hypersomnia: Secondary | ICD-10-CM

## 2023-06-15 DIAGNOSIS — G4700 Insomnia, unspecified: Secondary | ICD-10-CM | POA: Insufficient documentation

## 2023-06-15 DIAGNOSIS — E669 Obesity, unspecified: Secondary | ICD-10-CM | POA: Insufficient documentation

## 2023-06-15 NOTE — Patient Instructions (Signed)
Given your symptoms, I am concerned that you may have sleep disordered breathing with sleep apnea. You will need a sleep study for further evaluation. Someone will contact you to schedule this.   We discussed how untreated sleep apnea puts an individual at risk for cardiac arrhthymias, pulm HTN, DM, stroke and increases their risk for daytime accidents. We also briefly reviewed treatment options including weight loss, side sleeping position, oral appliance, CPAP therapy or referral to ENT for possible surgical options  Use caution when driving and pull over if you become sleepy.  Follow up in 6 weeks with Katie Cobb,NP to go over sleep study results, or sooner, if needed   

## 2023-06-15 NOTE — Progress Notes (Signed)
@Patient  ID: Ian Pineda, male    DOB: 1986-02-08, 37 y.o.   MRN: [id number]  Chief Complaint  Patient presents with   sleep consult    Sleep study >10y. C/o loud snoring, restless sleep, daytime sleepiness and witnessed apneas per wife.     Referring provider: Dale Grenelefe, MD  HPI: 37 year old male, former smoker referred for sleep consult. Past medical history significant for GERD, ED.   TEST/EVENTS:   06/15/2023: Today - sleep consult Patient presents today for sleep consult, referred by Dr. Lorin Picket. He has been having trouble with his sleep for many years. Noticed it more after his son was born in 2019. He started sleeping a little bit better as he got older but then his daughter was born last year and he feels like his sleep is even more restless. He's always had night awakenings but he feels like some nights, he just stares at the clock. He feels groggy in the morning and is tired during the day. Restless sleep at night; waking up often. His wife tells him he snores and stops breathing when he sleeps. He does tend to wake up with a dry mouth. Denies any morning headaches, drowsy driving, sleep parasomnias/paralysis.  He goes to bed between 9-10 pm. Can take him hours to fall asleep. He will occasionally use z quil which helps. Has tried melatonin but it didn't keep him asleep for long. Wakes 3-4 times a night. Gets up between 615-630 am. 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 Weight is up 10 lb over the last two years. He had a sleep study over 10 years ago but does not remember where or what the results were. Never been on CPAP. No O2 use.  No history of cardiac disease, stroke, diabetes. He is a former smoker; 3.5 pack year history. Quit 2020. Drinks a few alcoholic drinks on the weekends. He drinks energy drinks during the day to help keep him awake. He lives with his wife and 2 kids. Works as a [employer] certified accountant. Family history of emphysema, heart disease,  cancer.  Epworth 15   No Known Allergies  Immunization History  Administered Date(s) Administered   Influenza Split 08/19/2012, 08/20/2013, 09/24/2014   Influenza, Quadrivalent, Recombinant, Inj, Pf 09/26/2019   Influenza,inj,Quad PF,6+ Mos 09/20/2018, 08/26/2021   Influenza-Unspecified 09/08/2015, 08/15/2020, 08/15/2022   Tdap 01/29/2016    Past Medical History:  Diagnosis Date   Back pain    GERD (gastroesophageal reflux disease)    Syncope    one episode worked up (cardiology and endocrinology).  felt to be related to hypoglycemia    Tobacco History: Social History   Tobacco Use  Smoking Status Former   Average packs/day: 0.5 packs/day for 7.0 years (3.5 ttl pk-yrs)   Types: Cigarettes   Start date: 2013  Smokeless Tobacco Never  Tobacco Comments   socially    Counseling given: Not Answered Tobacco comments: socially    Outpatient Medications Prior to Visit  Medication Sig Dispense Refill   amoxicillin-clavulanate (AUGMENTIN) 875-125 MG tablet Take 1 tablet by mouth 2 (two) times daily.     esomeprazole (NEXIUM) 20 MG packet Take 20 mg by mouth daily before breakfast.     sildenafil (VIAGRA) 50 MG tablet Take 1 tablet (50 mg total) by mouth daily as needed for erectile dysfunction. 30 tablet 0   No facility-administered medications prior to visit.     Review of Systems:   Constitutional: No night sweats, fevers, chills, or  lassitude. +weight gain, fatigue  HEENT: No headaches, difficulty swallowing, tooth/dental problems, or sore throat. No sneezing, itching, ear ache, or post nasal drip. +nasal congestion  CV:  No chest pain, orthopnea, PND, swelling in lower extremities, anasarca, dizziness, palpitations, syncope Resp: +snoring, witnessed apneas; shortness of breath with exertion. No excess mucus or change in color of mucus. No productive or non-productive. No hemoptysis. No wheezing.  No chest wall deformity GI:  +heartburn/indigestion. No abdominal pain,  nausea, vomiting, diarrhea, change in bowel habits, loss of appetite, bloody stools.  GU: No dysuria, change in color of urine, urgency or frequency. Skin: No rash, lesions, ulcerations MSK:  No joint pain or swelling.   Neuro: No dizziness or lightheadedness.  Psych: No depression or anxiety. Mood stable. +sleep disturbance     Physical Exam:  BP 128/78 (BP Location: Left Arm, Cuff Size: Normal)   Pulse (!) 101   Temp 98.3 F (36.8 C) (Temporal)   Ht 6' (1.829 m)   Wt 285 lb (129.3 kg)   SpO2 98%   BMI 38.65 kg/m   GEN: Pleasant, interactive, well-appearing; obese; in no acute distress. HEENT:  Normocephalic and atraumatic. PERRLA. Sclera white. Nasal turbinates pink, moist and patent bilaterally. No rhinorrhea present. Oropharynx pink and moist, without exudate or edema. No lesions, ulcerations, or postnasal drip. Mallampati III NECK:  Supple w/ fair ROM. No JVD present. Normal carotid impulses w/o bruits. Thyroid symmetrical with no goiter or nodules palpated. No lymphadenopathy.   CV: RRR, no m/r/g, no peripheral edema. Pulses intact, +2 bilaterally. No cyanosis, pallor or clubbing. PULMONARY:  Unlabored, regular breathing. Clear bilaterally A&P w/o wheezes/rales/rhonchi. No accessory muscle use.  GI: BS present and normoactive. Soft, non-tender to palpation. No organomegaly or masses detected.  MSK: No erythema, warmth or tenderness. Cap refil <2 sec all extrem. No deformities or joint swelling noted.  Neuro: A/Ox3. No focal deficits noted.   Skin: Warm, no lesions or rashe Psych: Normal affect and behavior. Judgement and thought content appropriate.     Lab Results:  CBC    Component Value Date/Time   WBC 8.8 02/08/2023 0750   RBC 5.38 02/08/2023 0750   HGB 15.9 02/08/2023 0750   HCT 46.4 02/08/2023 0750   PLT 250.0 02/08/2023 0750   MCV 86.2 02/08/2023 0750   MCHC 34.3 02/08/2023 0750   RDW 13.3 02/08/2023 0750   LYMPHSABS 1.5 02/08/2023 0750   MONOABS 0.7  02/08/2023 0750   EOSABS 0.2 02/08/2023 0750   BASOSABS 0.1 02/08/2023 0750    BMET    Component Value Date/Time   NA 139 01/06/2023 0818   K 4.3 01/06/2023 0818   CL 103 01/06/2023 0818   CO2 26 01/06/2023 0818   GLUCOSE 96 01/06/2023 0818   BUN 14 01/06/2023 0818   CREATININE 0.94 01/06/2023 0818   CALCIUM 9.8 01/06/2023 0818    BNP No results found for: "BNP"   Imaging:  No results found.  Administration History     None           No data to display          No results found for: "NITRICOXIDE"      Assessment & Plan:   Excessive daytime sleepiness He has snoring, excessive daytime sleepiness, nocturnal apneic events, restless sleep. BMI 38. Epworth 15. Given this,  I am concerned he could have sleep disordered breathing with obstructive sleep apnea. He will need sleep study for further evaluation.    - discussed how weight  can impact sleep and risk for sleep disordered breathing - discussed options to assist with weight loss: combination of diet modification, cardiovascular and strength training exercises   - had an extensive discussion regarding the adverse health consequences related to untreated sleep disordered breathing - specifically discussed the risks for hypertension, coronary artery disease, cardiac dysrhythmias, cerebrovascular disease, and diabetes - lifestyle modification discussed   - discussed how sleep disruption can increase risk of accidents, particularly when driving - safe driving practices were discussed  Patient Instructions  Given your symptoms, I am concerned that you may have sleep disordered breathing with sleep apnea. You will need a sleep study for further evaluation. Someone will contact you to schedule this.   We discussed how untreated sleep apnea puts an individual at risk for cardiac arrhthymias, pulm HTN, DM, stroke and increases their risk for daytime accidents. We also briefly reviewed treatment options including  weight loss, side sleeping position, oral appliance, CPAP therapy or referral to ENT for possible surgical options  Use caution when driving and pull over if you become sleepy.  Follow up in 6 weeks with Florentina Addison Kimiah Hibner,NP to go over sleep study results, or sooner, if needed     Loud snoring See above  Witnessed apneic spells See above  Chronic insomnia Likely multifactorial. Will complete above workup for OSA and response to treatment, if appropriate. May consider additional pharmacological therapy if no improvement. Sleep hygiene reviewed.   Class 2 obesity with body mass index (BMI) of 38.0 to 38.9 in adult BMI 38. Healthy weight loss encouraged.    I spent 35 minutes of dedicated to the care of this patient on the date of this encounter to include pre-visit review of records, face-to-face time with the patient discussing conditions above, post visit ordering of testing, clinical documentation with the electronic health record, making appropriate referrals as documented, and communicating necessary findings to members of the patients care team.  Noemi Chapel, NP 06/15/2023  Pt aware and understands NP's role.

## 2023-06-15 NOTE — Assessment & Plan Note (Signed)
See above

## 2023-06-15 NOTE — Assessment & Plan Note (Signed)
BMI 38. Healthy weight loss encouraged.  

## 2023-06-15 NOTE — Assessment & Plan Note (Signed)
Likely multifactorial. Will complete above workup for OSA and response to treatment, if appropriate. May consider additional pharmacological therapy if no improvement. Sleep hygiene reviewed.

## 2023-06-15 NOTE — Assessment & Plan Note (Signed)
He has snoring, excessive daytime sleepiness, nocturnal apneic events, restless sleep. BMI 38. Epworth 15. Given this,  I am concerned he could have sleep disordered breathing with obstructive sleep apnea. He will need sleep study for further evaluation.    - discussed how weight can impact sleep and risk for sleep disordered breathing - discussed options to assist with weight loss: combination of diet modification, cardiovascular and strength training exercises   - had an extensive discussion regarding the adverse health consequences related to untreated sleep disordered breathing - specifically discussed the risks for hypertension, coronary artery disease, cardiac dysrhythmias, cerebrovascular disease, and diabetes - lifestyle modification discussed   - discussed how sleep disruption can increase risk of accidents, particularly when driving - safe driving practices were discussed  Patient Instructions  Given your symptoms, I am concerned that you may have sleep disordered breathing with sleep apnea. You will need a sleep study for further evaluation. Someone will contact you to schedule this.   We discussed how untreated sleep apnea puts an individual at risk for cardiac arrhthymias, pulm HTN, DM, stroke and increases their risk for daytime accidents. We also briefly reviewed treatment options including weight loss, side sleeping position, oral appliance, CPAP therapy or referral to ENT for possible surgical options  Use caution when driving and pull over if you become sleepy.  Follow up in 6 weeks with Katie Tristan Bramble,NP to go over sleep study results, or sooner, if needed

## 2023-06-30 ENCOUNTER — Encounter (INDEPENDENT_AMBULATORY_CARE_PROVIDER_SITE_OTHER): Payer: Self-pay

## 2023-07-07 ENCOUNTER — Telehealth: Payer: Self-pay | Admitting: Pulmonary Disease

## 2023-07-07 ENCOUNTER — Encounter (INDEPENDENT_AMBULATORY_CARE_PROVIDER_SITE_OTHER): Payer: 59

## 2023-07-07 DIAGNOSIS — G4733 Obstructive sleep apnea (adult) (pediatric): Secondary | ICD-10-CM

## 2023-07-07 DIAGNOSIS — G4719 Other hypersomnia: Secondary | ICD-10-CM

## 2023-07-07 NOTE — Telephone Encounter (Signed)
Call patient  Sleep study result  Date of study: 06/22/2023  Impression: Moderate obstructive sleep apnea with mild oxygen desaturations  Recommendation:  DME referral  Recommend CPAP therapy for moderate obstructive sleep apnea  Auto titrating CPAP with pressure settings of 5-15 will be appropriate  Encourage weight loss measures  Follow-up in the office 4 to 6 weeks following initiation of treatment

## 2023-07-07 NOTE — Telephone Encounter (Signed)
Patient does have a f/u with katie on 07/27/23 to go over sleep study result's . Nothing else further needed.

## 2023-07-27 ENCOUNTER — Telehealth (INDEPENDENT_AMBULATORY_CARE_PROVIDER_SITE_OTHER): Payer: 59 | Admitting: Nurse Practitioner

## 2023-07-27 ENCOUNTER — Encounter: Payer: Self-pay | Admitting: Nurse Practitioner

## 2023-07-27 DIAGNOSIS — F5104 Psychophysiologic insomnia: Secondary | ICD-10-CM

## 2023-07-27 DIAGNOSIS — G4719 Other hypersomnia: Secondary | ICD-10-CM | POA: Diagnosis not present

## 2023-07-27 DIAGNOSIS — G4733 Obstructive sleep apnea (adult) (pediatric): Secondary | ICD-10-CM | POA: Diagnosis not present

## 2023-07-27 NOTE — Assessment & Plan Note (Signed)
Possibly due to untreated OSA. Will reassess response to CPAP therapy at follow up. Sleep hygiene reviewed.

## 2023-07-27 NOTE — Progress Notes (Signed)
Patient ID: Ian Pineda, male     DOB: 1986-11-02, 37 y.o.      MRN: [id number]  Chief Complaint  Patient presents with   Follow-up    Hst f/u    Virtual Visit via Video Note  I connected with Ian Pineda on 07/27/23 at  2:00 PM EDT by a video enabled telemedicine application and verified that I am speaking with the correct person using two identifiers.  Location: Patient: Work Provider: Home   I discussed the limitations of evaluation and management by telemedicine and the availability of in person appointments. The patient expressed understanding and agreed to proceed.  History of Present Illness: 37 year old male, former smoker referred for sleep consult. Past medical history significant for GERD, ED.    TEST/EVENTS:  06/22/2023 HST: AHI 15.7/h, SpO2 low 87%  06/15/2023: OV with Safia Panzer NP for sleep consult, referred by Dr. Lorin Picket. He has been having trouble with his sleep for many years. Noticed it more after his son was born in 2019. He started sleeping a little bit better as he got older but then his daughter was born last year and he feels like his sleep is even more restless. He's always had night awakenings but he feels like some nights, he just stares at the clock. He feels groggy in the morning and is tired during the day. Restless sleep at night; waking up often. His wife tells him he snores and stops breathing when he sleeps. He does tend to wake up with a dry mouth. Denies any morning headaches, drowsy driving, sleep parasomnias/paralysis.  He goes to bed between 9-10 pm. Can take him hours to fall asleep. He will occasionally use z quil which helps. Has tried melatonin but it didn't keep him asleep for long. Wakes 3-4 times a night. Gets up between 615-630 am. 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 Weight is up 10 lb over the last two years. He had a sleep study over 10 years ago but does not remember where or what the results were. Never been on CPAP. No O2  use.  No history of cardiac disease, stroke, diabetes. He is a former smoker; 3.5 pack year history. Quit 2020. Drinks a few alcoholic drinks on the weekends. He drinks energy drinks during the day to help keep him awake. He lives with his wife and 2 kids. Works as a [employer] certified accountant. Family history of emphysema, heart disease, cancer. Epworth 15  07/27/2023: Today - follow up Patient presents today via video visit for follow up to discuss home sleep study results which revealed moderate sleep apnea. Feels unchanged compared to his last visit. Has a lot of trouble with falling and staying asleep. Always tired. Denies drowsy driving, sleep parasomnias/paralysis.   No Known Allergies Immunization History  Administered Date(s) Administered   Influenza Split 08/19/2012, 08/20/2013, 09/24/2014   Influenza, Quadrivalent, Recombinant, Inj, Pf 09/26/2019   Influenza,inj,Quad PF,6+ Mos 09/20/2018, 08/26/2021   Influenza-Unspecified 09/08/2015, 08/15/2020, 08/15/2022   Tdap 01/29/2016   Past Medical History:  Diagnosis Date   Back pain    GERD (gastroesophageal reflux disease)    Syncope    one episode worked up (cardiology and endocrinology).  felt to be related to hypoglycemia    Tobacco History: Social History   Tobacco Use  Smoking Status Former   Average packs/day: 0.5 packs/day for 7.0 years (3.5 ttl pk-yrs)   Types: Cigarettes   Start date: 2013  Smokeless Tobacco Never  Tobacco  Comments   socially    Counseling given: Not Answered Tobacco comments: socially    Outpatient Medications Prior to Visit  Medication Sig Dispense Refill   amoxicillin-clavulanate (AUGMENTIN) 875-125 MG tablet Take 1 tablet by mouth 2 (two) times daily.     esomeprazole (NEXIUM) 20 MG packet Take 20 mg by mouth daily before breakfast.     sildenafil (VIAGRA) 50 MG tablet Take 1 tablet (50 mg total) by mouth daily as needed for erectile dysfunction. 30 tablet 0   No facility-administered medications prior to  visit.     Review of Systems:   Constitutional: No night sweats, fevers, chills, or lassitude. +weight gain, fatigue  HEENT: No headaches, difficulty swallowing, tooth/dental problems, or sore throat. No sneezing, itching, ear ache, or post nasal drip. +nasal congestion  CV:  No chest pain, orthopnea, PND, swelling in lower extremities, anasarca, dizziness, palpitations, syncope Resp: +snoring, witnessed apneas; shortness of breath with exertion. No excess mucus or change in color of mucus. No productive or non-productive. No hemoptysis. No wheezing.  No chest wall deformity GI:  +heartburn/indigestion. No abdominal pain, nausea, vomiting, diarrhea, change in bowel habits, loss of appetite, bloody stools.  GU: No dysuria, change in color of urine, urgency or frequency. Skin: No rash, lesions, ulcerations MSK:  No joint pain or swelling.   Neuro: No dizziness or lightheadedness.  Psych: No depression or anxiety. Mood stable. +sleep disturbance   Observations/Objective: Patient is well-developed, well-nourished in no acute distress. A&Ox3. Resting comfortably at home. Unlabored breathing. Speech is clear and coherent with logical content.   Assessment and Plan: Moderate obstructive sleep apnea Moderate OSA with AHI 15.7/h. Reviewed risks of untreated OSA and potential treatment options. Shared decision to move forward with PAP therapy. Orders placed for auto CPAP 5-15 cmH2O, mask of choice and heated humidity. Educated on proper use/care of device. Risks/benefits reviewed. Aware of safe driving practices.  Patient Instructions  Start CPAP 5-15 cmH2O, mask of choice and heated humidity, every night, minimum of 4-6 hours a night.  Change equipment as directed. Wash your tubing with warm soap and water daily, hang to dry. Wash humidifier portion weekly. Use bottled, distilled water and change daily Be aware of reduced alertness and do not drive or operate heavy machinery if experiencing this or  drowsiness.  Exercise encouraged, as tolerated. Healthy weight management discussed.  Notify if persistent daytime sleepiness occurs even with consistent use of CPAP.  We discussed how untreated sleep apnea puts an individual at risk for cardiac arrhthymias, pulm HTN, DM, stroke and increases their risk for daytime accidents. We also briefly reviewed treatment options including weight loss, side sleeping position, oral appliance, CPAP therapy or referral to ENT for possible surgical options  Follow up in 10 weeks with Dr. Wynona Neat or Philis Nettle, or sooner, if needed    Chronic insomnia Possibly due to untreated OSA. Will reassess response to CPAP therapy at follow up. Sleep hygiene reviewed.     I discussed the assessment and treatment plan with the patient. The patient was provided an opportunity to ask questions and all were answered. The patient agreed with the plan and demonstrated an understanding of the instructions.   The patient was advised to call back or seek an in-person evaluation if the symptoms worsen or if the condition fails to improve as anticipated.  I provided 31 minutes of non-face-to-face time during this encounter.   Noemi Chapel, NP

## 2023-07-27 NOTE — Assessment & Plan Note (Signed)
Moderate OSA with AHI 15.7/h. Reviewed risks of untreated OSA and potential treatment options. Shared decision to move forward with PAP therapy. Orders placed for auto CPAP 5-15 cmH2O, mask of choice and heated humidity. Educated on proper use/care of device. Risks/benefits reviewed. Aware of safe driving practices.  Patient Instructions  Start CPAP 5-15 cmH2O, mask of choice and heated humidity, every night, minimum of 4-6 hours a night.  Change equipment as directed. Wash your tubing with warm soap and water daily, hang to dry. Wash humidifier portion weekly. Use bottled, distilled water and change daily Be aware of reduced alertness and do not drive or operate heavy machinery if experiencing this or drowsiness.  Exercise encouraged, as tolerated. Healthy weight management discussed.  Notify if persistent daytime sleepiness occurs even with consistent use of CPAP.  We discussed how untreated sleep apnea puts an individual at risk for cardiac arrhthymias, pulm HTN, DM, stroke and increases their risk for daytime accidents. We also briefly reviewed treatment options including weight loss, side sleeping position, oral appliance, CPAP therapy or referral to ENT for possible surgical options  Follow up in 10 weeks with Dr. Wynona Neat or Philis Nettle, or sooner, if needed

## 2023-07-27 NOTE — Patient Instructions (Signed)
Start CPAP 5-15 cmH2O, mask of choice and heated humidity, every night, minimum of 4-6 hours a night.  Change equipment as directed. Wash your tubing with warm soap and water daily, hang to dry. Wash humidifier portion weekly. Use bottled, distilled water and change daily Be aware of reduced alertness and do not drive or operate heavy machinery if experiencing this or drowsiness.  Exercise encouraged, as tolerated. Healthy weight management discussed.  Notify if persistent daytime sleepiness occurs even with consistent use of CPAP.  We discussed how untreated sleep apnea puts an individual at risk for cardiac arrhthymias, pulm HTN, DM, stroke and increases their risk for daytime accidents. We also briefly reviewed treatment options including weight loss, side sleeping position, oral appliance, CPAP therapy or referral to ENT for possible surgical options  Follow up in 10 weeks with Dr. Wynona Neat or Philis Nettle, or sooner, if needed

## 2023-08-08 ENCOUNTER — Ambulatory Visit: Payer: 59 | Admitting: Internal Medicine

## 2023-08-08 ENCOUNTER — Encounter: Payer: Self-pay | Admitting: Internal Medicine

## 2023-08-08 VITALS — BP 134/72 | HR 94 | Temp 98.0°F | Ht 72.0 in | Wt 281.8 lb

## 2023-08-08 DIAGNOSIS — G4719 Other hypersomnia: Secondary | ICD-10-CM | POA: Diagnosis not present

## 2023-08-08 DIAGNOSIS — Z13220 Encounter for screening for lipoid disorders: Secondary | ICD-10-CM

## 2023-08-08 DIAGNOSIS — R030 Elevated blood-pressure reading, without diagnosis of hypertension: Secondary | ICD-10-CM

## 2023-08-08 DIAGNOSIS — K219 Gastro-esophageal reflux disease without esophagitis: Secondary | ICD-10-CM

## 2023-08-08 DIAGNOSIS — R5383 Other fatigue: Secondary | ICD-10-CM

## 2023-08-08 DIAGNOSIS — G4733 Obstructive sleep apnea (adult) (pediatric): Secondary | ICD-10-CM

## 2023-08-08 DIAGNOSIS — Z23 Encounter for immunization: Secondary | ICD-10-CM | POA: Diagnosis not present

## 2023-08-08 DIAGNOSIS — F439 Reaction to severe stress, unspecified: Secondary | ICD-10-CM

## 2023-08-08 DIAGNOSIS — K602 Anal fissure, unspecified: Secondary | ICD-10-CM | POA: Diagnosis not present

## 2023-08-08 LAB — BASIC METABOLIC PANEL
BUN: 12 mg/dL (ref 6–23)
CO2: 25 mEq/L (ref 19–32)
Calcium: 9.4 mg/dL (ref 8.4–10.5)
Chloride: 103 mEq/L (ref 96–112)
Creatinine, Ser: 0.94 mg/dL (ref 0.40–1.50)
GFR: 105.2 mL/min (ref >60.00)
Glucose, Bld: 106 mg/dL — ABNORMAL HIGH (ref 70–99)
Potassium: 4 mEq/L (ref 3.5–5.1)
Sodium: 137 mEq/L (ref 135–145)

## 2023-08-08 NOTE — Addendum Note (Signed)
Addended by: Kristie Cowman on: 08/08/2023 08:19 AM   Modules accepted: Orders

## 2023-08-08 NOTE — Progress Notes (Signed)
Subjective:    Patient ID: Ian Pineda, male    DOB: 1986/05/05, 37 y.o.   MRN: [id number]  Patient here for  Chief Complaint  Patient presents with   Medication Management    HPI Here to follow up regarding GERD, previous anal fissure and sleep apnea. Recently saw pulmonary.  Had f/u 07/27/23 - sleep study revealed moderate sleep apnea. Recommended auto cpap 5-15 cm H20.  Plans to pick up tomorrow. Still having issues with sleep.  Increased fatigue.  Discussed driving precautions. Continues on nexium for acid reflux. No chest pain or sob reported.  Some increased stress, but overall he feels he is handling things relatively well. Taking colace as needed. Keeping bowels moving.  No pain now.    Past Medical History:  Diagnosis Date   Back pain    GERD (gastroesophageal reflux disease)    Syncope    one episode worked up (cardiology and endocrinology).  felt to be related to hypoglycemia   Past Surgical History:  Procedure Laterality Date   ANKLE SURGERY  2003   LEG SURGERY     WISDOM TOOTH EXTRACTION     Family History  Adopted: Yes   Social History   Socioeconomic History   Marital status: Married    Spouse name: Primary school teacher   Number of children: 0   Years of education: 14   Highest education level: Not on file  Occupational History   Occupation: Associate Professor  Tobacco Use   Smoking status: Former    Average packs/day: 0.5 packs/day for 7.0 years (3.5 ttl pk-yrs)    Types: Cigarettes    Start date: 2013   Smokeless tobacco: Never   Tobacco comments:    socially   Vaping Use   Vaping status: Never Used  Substance and Sexual Activity   Alcohol use: Yes    Alcohol/week: 0.0 standard drinks of alcohol    Comment: socially   Drug use: No   Sexual activity: Yes  Other Topics Concern   Not on file  Social History Narrative   Not on file   Social Determinants of Health   Financial Resource Strain: Not on file  Food Insecurity: Not on file  Transportation  Needs: Not on file  Physical Activity: Not on file  Stress: Not on file  Social Connections: Not on file     Review of Systems  Constitutional:  Positive for fatigue. Negative for appetite change and unexpected weight change.  HENT:  Negative for congestion and sinus pressure.   Respiratory:  Negative for cough, chest tightness and shortness of breath.   Cardiovascular:  Negative for chest pain and palpitations.  Gastrointestinal:  Negative for diarrhea, nausea and vomiting.  Genitourinary:  Negative for difficulty urinating and dysuria.  Musculoskeletal:  Negative for joint swelling and myalgias.  Skin:  Negative for color change and rash.  Neurological:  Negative for dizziness and headaches.  Psychiatric/Behavioral:  Negative for agitation and dysphoric mood.        Objective:     BP 134/72   Pulse 94   Temp 98 F (36.7 C) (Oral)   Ht 6' (1.829 m)   Wt 281 lb 12.8 oz (127.8 kg)   SpO2 98%   BMI 38.22 kg/m  Wt Readings from Last 3 Encounters:  08/08/23 281 lb 12.8 oz (127.8 kg)  06/15/23 285 lb (129.3 kg)  04/07/23 282 lb (127.9 kg)    Physical Exam Constitutional:      General: He is  not in acute distress.    Appearance: Normal appearance. He is well-developed.  HENT:     Head: Normocephalic and atraumatic.     Right Ear: External ear normal.     Left Ear: External ear normal.  Eyes:     General: No scleral icterus.       Right eye: No discharge.        Left eye: No discharge.  Cardiovascular:     Rate and Rhythm: Normal rate and regular rhythm.  Pulmonary:     Effort: Pulmonary effort is normal. No respiratory distress.     Breath sounds: Normal breath sounds.  Abdominal:     General: Bowel sounds are normal.     Palpations: Abdomen is soft.     Tenderness: There is no abdominal tenderness.  Musculoskeletal:        General: No swelling or tenderness.     Cervical back: Neck supple. No tenderness.  Lymphadenopathy:     Cervical: No cervical  adenopathy.  Skin:    Findings: No erythema or rash.  Neurological:     Mental Status: He is alert.  Psychiatric:        Mood and Affect: Mood normal.        Behavior: Behavior normal.      Outpatient Encounter Medications as of 08/08/2023  Medication Sig   esomeprazole (NEXIUM) 20 MG packet Take 20 mg by mouth daily before breakfast.   sildenafil (VIAGRA) 50 MG tablet Take 1 tablet (50 mg total) by mouth daily as needed for erectile dysfunction.   [DISCONTINUED] amoxicillin-clavulanate (AUGMENTIN) 875-125 MG tablet Take 1 tablet by mouth 2 (two) times daily.   No facility-administered encounter medications on file as of 08/08/2023.     Lab Results  Component Value Date   WBC 8.8 02/08/2023   HGB 15.9 02/08/2023   HCT 46.4 02/08/2023   PLT 250.0 02/08/2023   GLUCOSE 96 01/06/2023   CHOL 143 01/06/2023   TRIG 129.0 01/06/2023   HDL 53.90 01/06/2023   LDLCALC 64 01/06/2023   ALT 41 01/06/2023   AST 28 01/06/2023   NA 139 01/06/2023   K 4.3 01/06/2023   CL 103 01/06/2023   CREATININE 0.94 01/06/2023   BUN 14 01/06/2023   CO2 26 01/06/2023   TSH 1.07 01/06/2023   HGBA1C 5.5 01/29/2016    DG Chest 2 View  Result Date: 02/20/2021 CLINICAL DATA:  Cough. EXAM: CHEST - 2 VIEW COMPARISON:  None. FINDINGS: The heart size and mediastinal contours are within normal limits. Both lungs are clear. No visible pleural effusions or pneumothorax. No acute osseous abnormality. IMPRESSION: No active cardiopulmonary disease. Electronically Signed   By: Feliberto Harts MD   On: 02/20/2021 11:12       Assessment & Plan:  Moderate obstructive sleep apnea Assessment & Plan: Saw pulmonary.  Sleep study - moderate sleep apnea.  Plan cpap - tomorrow.    Screening cholesterol level  Fatigue, unspecified type -     Basic metabolic panel  Anal fissure Assessment & Plan: Doing better.  Bowels moving.  Taking colace. Not an issue now.    Excessive daytime sleepiness Assessment &  Plan: Saw pulmonary.  Sleep apnea.  Planning to get cpap tomorrow.    Gastroesophageal reflux disease, unspecified whether esophagitis present Assessment & Plan: Controlled on current regimen.  Follow.     Stress Assessment & Plan: Overall appears to be handling things relatively well.  Follow.     Elevated blood pressure  reading Assessment & Plan: Blood pressure varying.  Planning to treat sleep apnea.  Follow pressures.  Get him back in soon to reassess.       Dale Crowley, MD

## 2023-08-08 NOTE — Assessment & Plan Note (Signed)
Saw pulmonary.  Sleep study - moderate sleep apnea.  Plan cpap - tomorrow.

## 2023-08-08 NOTE — Assessment & Plan Note (Signed)
Controlled on current regimen.  Follow.

## 2023-08-08 NOTE — Assessment & Plan Note (Signed)
Overall appears to be handling things relatively well.  Follow.

## 2023-08-08 NOTE — Assessment & Plan Note (Signed)
Doing better.  Bowels moving.  Taking colace. Not an issue now.

## 2023-08-08 NOTE — Assessment & Plan Note (Signed)
Saw pulmonary.  Sleep apnea.  Planning to get cpap tomorrow.

## 2023-08-08 NOTE — Assessment & Plan Note (Signed)
Blood pressure varying.  Planning to treat sleep apnea.  Follow pressures.  Get him back in soon to reassess.

## 2023-08-25 ENCOUNTER — Telehealth: Payer: Self-pay | Admitting: Internal Medicine

## 2023-08-25 NOTE — Telephone Encounter (Signed)
Called and spoke with patient. He has been keeping an eye on his BP. Has been slightly elevated but the last few days has been higher. No blurred vision, faint or flush feeling, headache, etc. Last BP was 148/95. He did have a headache yesterday that was resolved with tylenol. Patient is going to check his BP tonight and in the morning. Scheduled for virtual visit with NP to discuss readings. Pt unable to come in the office.

## 2023-08-25 NOTE — Telephone Encounter (Addendum)
Pt called stating the provider wanted him to check his BP and he stated for the last two days it has been high and he had a headache. Pt stated for the last 24 hours it has been high and he took his BP this morning and it was 160/109. Pt would like to be called

## 2023-08-25 NOTE — Telephone Encounter (Signed)
Patient called to state he is following-up on his message from earlier today.    Patient states he took his blood pressure a couple of times since he called this morning with the following readings: 168/110 and around 10:30-11am it was 148/95.  Patient states his headache is coming on and off.  Patient states his blood pressure has been like this for the last two days and his head has been hurting during that time as well.  I transferred call to Access Nurse.

## 2023-08-26 ENCOUNTER — Telehealth: Payer: 59 | Admitting: Nurse Practitioner

## 2023-08-26 VITALS — BP 137/92 | HR 105 | Ht 72.0 in | Wt 273.0 lb

## 2023-08-26 DIAGNOSIS — I10 Essential (primary) hypertension: Secondary | ICD-10-CM | POA: Diagnosis not present

## 2023-08-26 MED ORDER — VALSARTAN 80 MG PO TABS: 80.0000 mg | ORAL_TABLET | Freq: Every day | ORAL | 0 refills | Status: DC

## 2023-08-26 NOTE — Progress Notes (Signed)
Virtual Visit via Video Note  I connected with Ian Pineda on 08/26/2023 at 10:32 AM by a video enabled telemedicine application and verified that I am speaking with the correct person using two identifiers.  Patient Location: Home Provider Location: Office/Clinic  I discussed the limitations, risks, security, and privacy concerns of performing an evaluation and management service by video and the availability of in person appointments. I also discussed with the patient that there may be a patient responsible charge related to this service. The patient expressed understanding and agreed to proceed.  Subjective: PCP: Dale Garza-Salinas II, MD  Chief Complaint  Patient presents with   Hypertension    Patient stated started having headaches that wouldn't go away   HPI  Patient is seen due to elevated blood pressure readings with associated symptom of headache.  He has history of obstructive sleep apnea and is using CPAP.  States that the quality of sleep has improved with CPAP.  He has been checking blood pressure intermittently with blood pressure readings between 130/85 and 138/90.  States on Wednesday had blood pressure reading of 160/105 , 170/109 and on Thursday 150/102 and 140/98 with associated symptoms of frontal headache without any vision changes, weakness of one side, chest pain or shortness of breath.  Headache is resolved at present.  He is adopted and states biological family members has history of hypertension and diabetes.  ROS: Per HPI  Current Outpatient Medications:    esomeprazole (NEXIUM) 20 MG packet, Take 20 mg by mouth daily before breakfast., Disp: , Rfl:    valsartan (DIOVAN) 80 MG tablet, Take 1 tablet (80 mg total) by mouth daily., Disp: 30 tablet, Rfl: 0   sildenafil (VIAGRA) 50 MG tablet, Take 1 tablet (50 mg total) by mouth daily as needed for erectile dysfunction. (Patient not taking: Reported on 08/26/2023), Disp: 30 tablet, Rfl:  0  Observations/Objective: Today's Vitals   08/26/23 1026  BP: (!) 137/92  Pulse: (!) 105  Weight: 273 lb (123.8 kg)  Height: 6' (1.829 m)   Physical Exam Constitutional:      General: He is not in acute distress.    Appearance: Normal appearance. He is not ill-appearing.  Eyes:     Conjunctiva/sclera: Conjunctivae normal.  Pulmonary:     Effort: No respiratory distress.  Neurological:     Mental Status: He is alert.  Psychiatric:        Mood and Affect: Mood normal.        Behavior: Behavior normal.        Thought Content: Thought content normal.        Judgment: Judgment normal.     Assessment and Plan: Primary hypertension Assessment & Plan: Blood pressure elevated, will start on valsartan 80 mg once a day. Recommended low-salt diet. Continue diet rich in fruits and veggies whole grains and lean protein. Encourage moderate aerobic exercise for at least 150 minutes a week. Advised to check blood pressure and pulse at home and bring readings to the next appointment. If pulse remains elevated would consider adding beta-blocker for rate control. Will check potassium level in a week and follow-up with PCP in 1 month.  Orders: -     Valsartan; Take 1 tablet (80 mg total) by mouth daily.  Dispense: 30 tablet; Refill: 0 -     Potassium; Future    Follow Up Instructions: Return in about 1 month (around 09/26/2023) for hypertension and  in 1 week follow-up.   I discussed the assessment  and treatment plan with the patient. The patient was provided an opportunity to ask questions, and all were answered. The patient agreed with the plan and demonstrated an understanding of the instructions.   The patient was advised to call back or seek an in-person evaluation if the symptoms worsen or if the condition fails to improve as anticipated.  The above assessment and management plan was discussed with the patient. The patient verbalized understanding of and has agreed to the  management plan.   Kara Dies, NP

## 2023-08-28 ENCOUNTER — Encounter: Payer: Self-pay | Admitting: Nurse Practitioner

## 2023-08-28 ENCOUNTER — Encounter: Payer: Self-pay | Admitting: Internal Medicine

## 2023-08-28 DIAGNOSIS — I10 Essential (primary) hypertension: Secondary | ICD-10-CM | POA: Insufficient documentation

## 2023-08-28 NOTE — Patient Instructions (Signed)
Potassium check in 1 week. Check blood pressure and pulse at home and bring readings to the next appointment.  Valsartan Tablets What is this medication? VALSARTAN (val SAR tan) treats high blood pressure and heart failure. It may also be used to prevent further damage after a heart attack. It works by relaxing the blood vessels, which decreases blood pressure and the amount of work the heart has to do. It belongs to a group of medications called ARBs. This medicine may be used for other purposes; ask your health care provider or pharmacist if you have questions. COMMON BRAND NAME(S): Diovan What should I tell my care team before I take this medication? They need to know if you have any of these conditions: Heart failure Kidney disease Liver disease An unusual or allergic reaction to valsartan, other medications, foods, dyes, or preservatives Pregnant or trying to get pregnant Breast-feeding How should I use this medication? Take this medication by mouth. Take it as directed on the prescription label at the same time every day. You can take it with or without food. If it upsets your stomach, take it with food. Keep taking it unless your care team tells you to stop. Talk to your care team about the use of this medication in children. While it may be prescribed for children as young as 1 for selected conditions, precautions do apply. Overdosage: If you think you have taken too much of this medicine contact a poison control center or emergency room at once. NOTE: This medicine is only for you. Do not share this medicine with others. What if I miss a dose? If you miss a dose, take it as soon as you can. If it is almost time for your next dose, take only that dose. Do not take double or extra doses. What may interact with this medication? Aliskiren ACE inhibitors, such as enalapril or lisinopril Diuretics, especially amiloride, eplerenone, spironolactone, or triamterene Lithium NSAIDs,  medications for pain and inflammation, such as ibuprofen or naproxen Potassium salts or potassium supplements This list may not describe all possible interactions. Give your health care provider a list of all the medicines, herbs, non-prescription drugs, or dietary supplements you use. Also tell them if you smoke, drink alcohol, or use illegal drugs. Some items may interact with your medicine. What should I watch for while using this medication? Visit your care team for regular checks on your progress. Check your blood pressure as directed. Ask your care team what your blood pressure should be. Also, find out when you should contact him or her. Do not treat yourself for coughs, colds, or pain while you are taking this medication without asking your care team for advice. Some medications may increase your blood pressure. Women should inform their care team if they wish to become pregnant or think they might be pregnant. There is a potential for serious side effects to an unborn child. Talk to your care team for more information. This medication may affect your coordination, reaction time, or judgment. Do not drive or operate machinery until you know how this medication affects you. Sit up or stand slowly to reduce the risk of dizzy or fainting spells. Drinking alcohol with this medication can increase the risk of these side effects. Avoid salt substitutes unless you are told otherwise by your care team. What side effects may I notice from receiving this medication? Side effects that you should report to your care team as soon as possible: Allergic reactions--skin rash, itching, hives, swelling of  the face, lips, tongue, or throat High potassium level--muscle weakness, fast or irregular heartbeat Kidney injury--decrease in the amount of urine, swelling of the ankles, hands, or feet Low blood pressure--dizziness, feeling faint or lightheaded, blurry vision Side effects that usually do not require medical  attention (report to your care team if they continue or are bothersome): Dizziness Fatigue Headache This list may not describe all possible side effects. Call your doctor for medical advice about side effects. You may report side effects to FDA at 1-800-FDA-1088. Where should I keep my medication? Keep out of the reach of children and pets. Store at room temperature between 20 and 25 degrees C (68 and 77 degrees F). Protect from moisture. Keep the container tightly closed. Get rid of any unused medication after the expiration date. To get rid of medications that are no longer needed or have expired: Take the medication to a medication take-back program. Check with your pharmacy or law enforcement to find a location. If you cannot return the medication, check the label or package insert to see if the medication should be thrown out in the garbage or flushed down the toilet. If you are not sure, ask your care team. If it is safe to put it in the trash, take the medication out of the container. Mix the medication with cat litter, dirt, coffee grounds, or other unwanted substance. Seal the mixture in a bag or container. Put it in the trash. NOTE: This sheet is a summary. It may not cover all possible information. If you have questions about this medicine, talk to your doctor, pharmacist, or health care provider.  2024 Elsevier/Gold Standard (2021-10-02 00:00:00)

## 2023-08-28 NOTE — Assessment & Plan Note (Addendum)
Blood pressure elevated, will start on valsartan 80 mg once a day. Recommended low-salt diet. Continue diet rich in fruits and veggies whole grains and lean protein. Encourage moderate aerobic exercise for at least 150 minutes a week. Advised to check blood pressure and pulse at home and bring readings to the next appointment. If pulse remains elevated would consider adding beta-blocker for rate control. Will check potassium level in a week and follow-up with PCP in 1 month.

## 2023-08-30 NOTE — Telephone Encounter (Signed)
Agree with continued spot checking - schedule appt with me to f/u regarding his blood pressure.

## 2023-08-31 NOTE — Telephone Encounter (Signed)
He has f/u scheduled.

## 2023-09-05 ENCOUNTER — Other Ambulatory Visit (INDEPENDENT_AMBULATORY_CARE_PROVIDER_SITE_OTHER): Payer: 59

## 2023-09-05 DIAGNOSIS — I10 Essential (primary) hypertension: Secondary | ICD-10-CM | POA: Diagnosis not present

## 2023-09-06 LAB — POTASSIUM: Potassium: 4.2 meq/L (ref 3.5–5.1)

## 2023-09-14 ENCOUNTER — Telehealth: Payer: 59 | Admitting: Nurse Practitioner

## 2023-09-14 ENCOUNTER — Encounter: Payer: Self-pay | Admitting: Nurse Practitioner

## 2023-09-14 DIAGNOSIS — G4733 Obstructive sleep apnea (adult) (pediatric): Secondary | ICD-10-CM | POA: Diagnosis not present

## 2023-09-14 NOTE — Progress Notes (Signed)
Patient ID: Ian Pineda, male     DOB: 07/21/1986, 37 y.o.      MRN: [id number]  Chief Complaint  Patient presents with   Follow-up    Pt here for Sleep Study Results.    Virtual Visit via Video Note  I connected with STAVROS BARNHARDT on 09/14/23 at 10:00 AM EDT by a video enabled telemedicine application and verified that I am speaking with the correct person using two identifiers.  Location: Patient: Work Provider: Home   I discussed the limitations of evaluation and management by telemedicine and the availability of in person appointments. The patient expressed understanding and agreed to proceed.  History of Present Illness: 37 year old male, former smoker referred for sleep consult. Past medical history significant for GERD, ED.    TEST/EVENTS:  06/22/2023 HST: AHI 15.7/h, SpO2 low 87%  06/15/2023: OV with Maryela Tapper NP for sleep consult, referred by Dr. Lorin Picket. He has been having trouble with his sleep for many years. Noticed it more after his son was born in 2019. He started sleeping a little bit better as he got older but then his daughter was born last year and he feels like his sleep is even more restless. He's always had night awakenings but he feels like some nights, he just stares at the clock. He feels groggy in the morning and is tired during the day. Restless sleep at night; waking up often. His wife tells him he snores and stops breathing when he sleeps. He does tend to wake up with a dry mouth. Denies any morning headaches, drowsy driving, sleep parasomnias/paralysis.  He goes to bed between 9-10 pm. Can take him hours to fall asleep. He will occasionally use z quil which helps. Has tried melatonin but it didn't keep him asleep for long. Wakes 3-4 times a night. Gets up between 615-630 am. 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 Weight is up 10 lb over the last two years. He had a sleep study over 10 years ago but does not remember where or what the results were.  Never been on CPAP. No O2 use.  No history of cardiac disease, stroke, diabetes. He is a former smoker; 3.5 pack year history. Quit 2020. Drinks a few alcoholic drinks on the weekends. He drinks energy drinks during the day to help keep him awake. He lives with his wife and 2 kids. Works as a [employer] certified accountant. Family history of emphysema, heart disease, cancer. Epworth 15  07/27/2023: OV with Traniya Prichett NP for follow up to discuss home sleep study results which revealed moderate sleep apnea. Feels unchanged compared to his last visit. Has a lot of trouble with falling and staying asleep. Always tired. Denies drowsy driving, sleep parasomnias/paralysis.   09/14/2023: Today - follow up Patient presents today for follow up after starting on CPAP. He has been doing well with this. He feels like his sleep quality is much better. Better rested during the day. Still has some shorter periods of sleep, related to his kids waking him up. He did have some trouble with viral illness and his nose feeling stopped up. The mask was hard to wear with this. He had reached out to the DME for a full face but never heard back. Currently using nasal pillow mask. Pressures feel appropriate. No bothersome leaks.  08/15/2023-09/13/2023: CPAP 5-15 cmH2O 30/30 days; 83% >4 hours; average use 6 hours 29 minutes Pressure 95th 8.2 Leaks 95th 22.4 AHI 1.4   No Known  Allergies Immunization History  Administered Date(s) Administered   Influenza Split 08/19/2012, 08/20/2013, 09/24/2014   Influenza, Quadrivalent, Recombinant, Inj, Pf 09/26/2019   Influenza, Seasonal, Injecte, Preservative Fre 08/08/2023   Influenza,inj,Quad PF,6+ Mos 09/20/2018, 08/26/2021   Influenza-Unspecified 09/08/2015, 08/15/2020, 08/15/2022   Tdap 01/29/2016   Past Medical History:  Diagnosis Date   Back pain    GERD (gastroesophageal reflux disease)    Syncope    one episode worked up (cardiology and endocrinology).  felt to be related to hypoglycemia     Tobacco History: Social History   Tobacco Use  Smoking Status Former   Average packs/day: 0.5 packs/day for 7.0 years (3.5 ttl pk-yrs)   Types: Cigarettes   Start date: 2013  Smokeless Tobacco Never  Tobacco Comments   socially    Counseling given: Not Answered Tobacco comments: socially    Outpatient Medications Prior to Visit  Medication Sig Dispense Refill   esomeprazole (NEXIUM) 20 MG packet Take 20 mg by mouth daily before breakfast.     sildenafil (VIAGRA) 50 MG tablet Take 1 tablet (50 mg total) by mouth daily as needed for erectile dysfunction. 30 tablet 0   valsartan (DIOVAN) 80 MG tablet Take 1 tablet (80 mg total) by mouth daily. 30 tablet 0   No facility-administered medications prior to visit.     Review of Systems:   Constitution+fatigue (improved) HEENT: No headaches, difficulty swallowing, tooth/dental problems, or sore throat. No sneezing, itching, ear ache, or post nasal drip. +nasal congestion  CV:  No chest pain, orthopnea, PND, swelling in lower extremities, anasarca, dizziness, palpitations, syncope Resp: +shortness of breath with exertion (baseline). No excess mucus or change in color of mucus. No productive or non-productive. No hemoptysis. No wheezing.  No chest wall deformity GI:  No heartburn, abdominal pain, nausea, vomiting, diarrhea, change in bowel habits, loss of appetite, bloody stools.  GU: No nocturia Skin: No rash, lesions, ulcerations MSK:  No joint pain or swelling.   Neuro: No dizziness or lightheadedness.  Psych: No depression or anxiety. Mood stable. +sleep disturbance (improved)  Observations/Objective: Patient is well-developed, well-nourished in no acute distress. A&Ox3. Resting comfortably at home. Unlabored breathing. Speech is clear and coherent with logical content.   Assessment and Plan: Moderate obstructive sleep apnea Moderate OSA on CPAP. Excellent compliance and control. Receives benefit from use. Aware of proper  care/use of device. Safe driving practices reviewed. Will send for him to try DreamWear full face due to nasal congestion. Understands to notify if this doesn't work or he feels any impact to his sleep.   Patient Instructions  Continue to use CPAP every night, minimum of 4-6 hours a night.  Change equipment as directed. Wash your tubing with warm soap and water daily, hang to dry. Wash humidifier portion weekly. Use bottled, distilled water and change daily Be aware of reduced alertness and do not drive or operate heavy machinery if experiencing this or drowsiness.  Exercise encouraged, as tolerated. Healthy weight management discussed.  Avoid or decrease alcohol consumption and medications that make you more sleepy, if possible. Notify if persistent daytime sleepiness occurs even with consistent use of PAP therapy.  DreamWear full face mask ordered for you to try  Follow up in 6 months with Katie Laterria Lasota,NP or sooner, if needed     I discussed the assessment and treatment plan with the patient. The patient was provided an opportunity to ask questions and all were answered. The patient agreed with the plan and demonstrated an understanding of  the instructions.   The patient was advised to call back or seek an in-person evaluation if the symptoms worsen or if the condition fails to improve as anticipated.  I provided 25 minutes of non-face-to-face time during this encounter.   Noemi Chapel, NP

## 2023-09-14 NOTE — Assessment & Plan Note (Signed)
Moderate OSA on CPAP. Excellent compliance and control. Receives benefit from use. Aware of proper care/use of device. Safe driving practices reviewed. Will send for him to try DreamWear full face due to nasal congestion. Understands to notify if this doesn't work or he feels any impact to his sleep.   Patient Instructions  Continue to use CPAP every night, minimum of 4-6 hours a night.  Change equipment as directed. Wash your tubing with warm soap and water daily, hang to dry. Wash humidifier portion weekly. Use bottled, distilled water and change daily Be aware of reduced alertness and do not drive or operate heavy machinery if experiencing this or drowsiness.  Exercise encouraged, as tolerated. Healthy weight management discussed.  Avoid or decrease alcohol consumption and medications that make you more sleepy, if possible. Notify if persistent daytime sleepiness occurs even with consistent use of PAP therapy.  DreamWear full face mask ordered for you to try  Follow up in 6 months with Ian Jasalyn Frysinger,NP or sooner, if needed

## 2023-09-14 NOTE — Patient Instructions (Signed)
Continue to use CPAP every night, minimum of 4-6 hours a night.  Change equipment as directed. Wash your tubing with warm soap and water daily, hang to dry. Wash humidifier portion weekly. Use bottled, distilled water and change daily Be aware of reduced alertness and do not drive or operate heavy machinery if experiencing this or drowsiness.  Exercise encouraged, as tolerated. Healthy weight management discussed.  Avoid or decrease alcohol consumption and medications that make you more sleepy, if possible. Notify if persistent daytime sleepiness occurs even with consistent use of PAP therapy.  DreamWear full face mask ordered for you to try  Follow up in 6 months with Katie Cordarro Spinnato,NP or sooner, if needed

## 2023-09-27 ENCOUNTER — Other Ambulatory Visit: Payer: Self-pay | Admitting: Nurse Practitioner

## 2023-09-27 DIAGNOSIS — I10 Essential (primary) hypertension: Secondary | ICD-10-CM

## 2023-09-29 ENCOUNTER — Other Ambulatory Visit: Payer: Self-pay | Admitting: Nurse Practitioner

## 2023-09-29 DIAGNOSIS — I10 Essential (primary) hypertension: Secondary | ICD-10-CM

## 2023-09-30 MED ORDER — VALSARTAN 80 MG PO TABS: 80.0000 mg | ORAL_TABLET | Freq: Every day | ORAL | 0 refills | Status: DC

## 2023-10-12 ENCOUNTER — Ambulatory Visit: Payer: 59 | Admitting: Internal Medicine

## 2023-10-16 NOTE — Progress Notes (Unsigned)
Patient ID: Ian Pineda, male   DOB: 07-Feb-1986, 37 y.o.   MRN: [id number]   Virtual Visit via video Note  I connected with Burna Cash by a video enabled telemedicine application and verified that I am speaking with the correct person using two identifiers. Location patient: home Location provider: work  Persons participating in the virtual visit: patient, provider  The limitations, risks, security and privacy concerns of performing an evaluation and management service by video and the availability of in person appointments have been discussed.  It has also been discussed with the patient that there may be a patient responsible charge related to this service. The patient expressed understanding and agreed to proceed.  Interactive audio and video telecommunications were attempted between this provider and patient, however failed, due to patient having technical difficulties OR patient did not have access to video capability.  We continued and completed visit with audio only. ***  Reason for visit: follow up appt  HPI: Here for f/u regarding blood pressure and sleep apnea. Evaluated 08/26/23 Evelene Croon - elevated blood pressure and headache. Was started on valsartan. Had f/u with pulmonary 09/14/23 - f/u regarding sleep apnea on cpap. Doing well with cpap.    ROS: See pertinent positives and negatives per HPI.  Past Medical History:  Diagnosis Date   Back pain    GERD (gastroesophageal reflux disease)    Syncope    one episode worked up (cardiology and endocrinology).  felt to be related to hypoglycemia    Past Surgical History:  Procedure Laterality Date   ANKLE SURGERY  2003   LEG SURGERY     WISDOM TOOTH EXTRACTION      Family History  Adopted: Yes    SOCIAL HX: ***   Current Outpatient Medications:    esomeprazole (NEXIUM) 20 MG packet, Take 20 mg by mouth daily before breakfast., Disp: , Rfl:    sildenafil (VIAGRA) 50 MG tablet, Take 1 tablet (50 mg total) by mouth  daily as needed for erectile dysfunction., Disp: 30 tablet, Rfl: 0   valsartan (DIOVAN) 80 MG tablet, Take 1 tablet (80 mg total) by mouth daily., Disp: 30 tablet, Rfl: 0  EXAM:  VITALS per patient if applicable:  GENERAL: alert, oriented, appears well and in no acute distress  HEENT: atraumatic, conjunttiva clear, no obvious abnormalities on inspection of external nose and ears  NECK: normal movements of the head and neck  LUNGS: on inspection no signs of respiratory distress, breathing rate appears normal, no obvious gross SOB, gasping or wheezing  CV: no obvious cyanosis  MS: moves all visible extremities without noticeable abnormality  PSYCH/NEURO: pleasant and cooperative, no obvious depression or anxiety, speech and thought processing grossly intact  ASSESSMENT AND PLAN:  Discussed the following assessment and plan:  Problem List Items Addressed This Visit   None   No follow-ups on file.   I discussed the assessment and treatment plan with the patient. The patient was provided an opportunity to ask questions and all were answered. The patient agreed with the plan and demonstrated an understanding of the instructions.   The patient was advised to call back or seek an in-person evaluation if the symptoms worsen or if the condition fails to improve as anticipated.  I provided *** minutes of non-face-to-face time during this encounter.   Dale [place], MD

## 2023-10-17 ENCOUNTER — Telehealth: Payer: 59 | Admitting: Internal Medicine

## 2023-10-17 ENCOUNTER — Ambulatory Visit: Payer: 59 | Admitting: Internal Medicine

## 2023-10-17 ENCOUNTER — Encounter: Payer: Self-pay | Admitting: Internal Medicine

## 2023-10-17 VITALS — BP 145/85 | Ht 72.0 in | Wt 273.0 lb

## 2023-10-17 DIAGNOSIS — G4733 Obstructive sleep apnea (adult) (pediatric): Secondary | ICD-10-CM | POA: Diagnosis not present

## 2023-10-17 DIAGNOSIS — R200 Anesthesia of skin: Secondary | ICD-10-CM | POA: Diagnosis not present

## 2023-10-17 DIAGNOSIS — I10 Essential (primary) hypertension: Secondary | ICD-10-CM | POA: Diagnosis not present

## 2023-10-17 DIAGNOSIS — F439 Reaction to severe stress, unspecified: Secondary | ICD-10-CM | POA: Diagnosis not present

## 2023-10-17 MED ORDER — VALSARTAN 160 MG PO TABS: 160.0000 mg | ORAL_TABLET | Freq: Every day | ORAL | 1 refills | Status: DC

## 2023-10-17 NOTE — Assessment & Plan Note (Signed)
Blood pressure elevated as outlined. On valsartan 80mg  q day.  Given persistent elevation, will increase valsartan to 160mg  q day.  Follow pressures and send in readings.  Had recent potassium check.  Needs met b - to assess kidney function.

## 2023-10-17 NOTE — Assessment & Plan Note (Signed)
Numbness of left hand/forearm. Noticed on two occasions - when waking up.  Once up and moving arm - resolves.  New pillow now. Neck better.  No chest pain or sob with increased activity or exertion.  Discussed possible CTS. Discussed further w/up and evaluation.  Wants to monitor.

## 2023-10-17 NOTE — Assessment & Plan Note (Signed)
Saw pulmonary.  Sleep study - moderate sleep apnea.  Continue cpap.

## 2023-10-17 NOTE — Assessment & Plan Note (Signed)
Overall appears to be handling things relatively well.  Follow.   

## 2023-10-20 ENCOUNTER — Telehealth: Payer: Self-pay | Admitting: Internal Medicine

## 2023-10-20 DIAGNOSIS — I10 Essential (primary) hypertension: Secondary | ICD-10-CM

## 2023-10-20 NOTE — Telephone Encounter (Signed)
Patient need lab orders.

## 2023-10-25 ENCOUNTER — Other Ambulatory Visit (INDEPENDENT_AMBULATORY_CARE_PROVIDER_SITE_OTHER): Payer: 59

## 2023-10-25 DIAGNOSIS — I10 Essential (primary) hypertension: Secondary | ICD-10-CM | POA: Diagnosis not present

## 2023-10-26 LAB — BASIC METABOLIC PANEL
BUN: 12 mg/dL (ref 6–23)
CO2: 26 meq/L (ref 19–32)
Calcium: 8.8 mg/dL (ref 8.4–10.5)
Chloride: 103 meq/L (ref 96–112)
Creatinine, Ser: 1.23 mg/dL (ref 0.40–1.50)
GFR: 76.07 mL/min (ref >60.00)
Glucose, Bld: 110 mg/dL — ABNORMAL HIGH (ref 70–99)
Potassium: 3.8 meq/L (ref 3.5–5.1)
Sodium: 138 meq/L (ref 135–145)

## 2023-10-27 ENCOUNTER — Other Ambulatory Visit: Payer: Self-pay

## 2023-10-27 DIAGNOSIS — R030 Elevated blood-pressure reading, without diagnosis of hypertension: Secondary | ICD-10-CM

## 2023-11-04 NOTE — Addendum Note (Signed)
Addended by: Warden Fillers on: 11/04/2023 03:31 PM   Modules accepted: Orders

## 2023-11-11 ENCOUNTER — Other Ambulatory Visit (INDEPENDENT_AMBULATORY_CARE_PROVIDER_SITE_OTHER): Payer: 59

## 2023-11-11 DIAGNOSIS — R030 Elevated blood-pressure reading, without diagnosis of hypertension: Secondary | ICD-10-CM | POA: Diagnosis not present

## 2023-11-11 LAB — BASIC METABOLIC PANEL
BUN: 11 mg/dL (ref 6–23)
CO2: 29 meq/L (ref 19–32)
Calcium: 9.2 mg/dL (ref 8.4–10.5)
Chloride: 101 meq/L (ref 96–112)
Creatinine, Ser: 0.91 mg/dL (ref 0.40–1.50)
GFR: 109.18 mL/min (ref >60.00)
Glucose, Bld: 100 mg/dL — ABNORMAL HIGH (ref 70–99)
Potassium: 4.4 meq/L (ref 3.5–5.1)
Sodium: 137 meq/L (ref 135–145)

## 2023-11-17 ENCOUNTER — Encounter: Payer: Self-pay | Admitting: Internal Medicine

## 2023-11-17 NOTE — Telephone Encounter (Signed)
 Still a little higher than I would like to see it stay. Please confirm he is doing ok. Please schedule him a f/u with me to discuss adjusting medication.

## 2023-11-18 NOTE — Telephone Encounter (Signed)
 Spoke to pt. Pt stated that he is doing fine no complaints of headaches or chest pain. Advise pt to keep checking bp and sending updates. Pt is scheduled for a f/up on 12/14/23

## 2023-11-18 NOTE — Telephone Encounter (Signed)
 Have him continue to spot check his pressures.  I can see him earlier 12/01/23 (can use same day slot)

## 2023-11-18 NOTE — Telephone Encounter (Signed)
 Pt unable to come in earlier. Will continue to monitor and send in readings.

## 2023-12-07 ENCOUNTER — Encounter: Payer: Self-pay | Admitting: Internal Medicine

## 2023-12-08 NOTE — Telephone Encounter (Signed)
Please call and notify him that his blood pressure is trending down. Have him continue to monitor and make sure he has a f/u appt scheduled with me.

## 2023-12-12 ENCOUNTER — Other Ambulatory Visit: Payer: Self-pay | Admitting: Internal Medicine

## 2023-12-14 ENCOUNTER — Encounter: Payer: Self-pay | Admitting: Internal Medicine

## 2023-12-14 ENCOUNTER — Ambulatory Visit: Payer: 59 | Admitting: Internal Medicine

## 2023-12-14 VITALS — BP 130/78 | HR 89 | Temp 98.0°F | Resp 16 | Ht 72.0 in | Wt 273.0 lb

## 2023-12-14 DIAGNOSIS — K219 Gastro-esophageal reflux disease without esophagitis: Secondary | ICD-10-CM

## 2023-12-14 DIAGNOSIS — F439 Reaction to severe stress, unspecified: Secondary | ICD-10-CM

## 2023-12-14 DIAGNOSIS — G4733 Obstructive sleep apnea (adult) (pediatric): Secondary | ICD-10-CM

## 2023-12-14 DIAGNOSIS — M545 Low back pain, unspecified: Secondary | ICD-10-CM | POA: Diagnosis not present

## 2023-12-14 DIAGNOSIS — R051 Acute cough: Secondary | ICD-10-CM

## 2023-12-14 DIAGNOSIS — B349 Viral infection, unspecified: Secondary | ICD-10-CM

## 2023-12-14 DIAGNOSIS — I10 Essential (primary) hypertension: Secondary | ICD-10-CM | POA: Diagnosis not present

## 2023-12-14 LAB — BASIC METABOLIC PANEL
BUN: 11 mg/dL (ref 6–23)
CO2: 27 meq/L (ref 19–32)
Calcium: 9.1 mg/dL (ref 8.4–10.5)
Chloride: 104 meq/L (ref 96–112)
Creatinine, Ser: 1.01 mg/dL (ref 0.40–1.50)
GFR: 96.27 mL/min (ref >60.00)
Glucose, Bld: 101 mg/dL — ABNORMAL HIGH (ref 70–99)
Potassium: 4 meq/L (ref 3.5–5.1)
Sodium: 138 meq/L (ref 135–145)

## 2023-12-14 LAB — HEPATIC FUNCTION PANEL
ALT: 25 U/L (ref 0–53)
AST: 17 U/L (ref 0–37)
Albumin: 4.4 g/dL (ref 3.5–5.2)
Alkaline Phosphatase: 61 U/L (ref 39–117)
Bilirubin, Direct: 0.1 mg/dL (ref 0.0–0.3)
Total Bilirubin: 0.4 mg/dL (ref 0.2–1.2)
Total Protein: 7.1 g/dL (ref 6.0–8.3)

## 2023-12-14 LAB — LIPID PANEL
Cholesterol: 103 mg/dL (ref 0–200)
HDL: 42.5 mg/dL (ref >39.00)
LDL Cholesterol: 40 mg/dL (ref 0–99)
NonHDL: 60.38
Total CHOL/HDL Ratio: 2
Triglycerides: 104 mg/dL (ref 0.0–149.0)
VLDL: 20.8 mg/dL (ref 0.0–40.0)

## 2023-12-14 MED ORDER — PREDNISONE 10 MG PO TABS: ORAL_TABLET | ORAL | 0 refills | Status: DC

## 2023-12-14 MED ORDER — AMOXICILLIN-POT CLAVULANATE 875-125 MG PO TABS: 1.0000 | ORAL_TABLET | Freq: Two times a day (BID) | ORAL | 0 refills | Status: DC

## 2023-12-14 NOTE — Progress Notes (Signed)
Subjective:    Patient ID: Ian Pineda, male    DOB: [birth year], 38 y.o.   MRN: [id number]  Patient here for  Chief Complaint  Patient presents with   Medical Management of Chronic Issues    HPI Here for a scheduled follow up - f/u regarding his blood pressure. Last visit, valsartan was increased to 160mg  q day. Recent blood pressures reviewed and appeared to be trending down.  Using cpap regularly. He does report that starting yesterday, he noticed some eye swelling. Noticed increased cough, sneezing. Some body aches. Sore throat - am. Increased thirst. Chest congestion. Productive cough - green mucus. No sob. Also, overextended his back - low back - picking up his daughter. Some low back pain. No radicular symptoms. Taking ibuprofen and tylenol.    Past Medical History:  Diagnosis Date   Back pain    GERD (gastroesophageal reflux disease)    Syncope    one episode worked up (cardiology and endocrinology).  felt to be related to hypoglycemia   Past Surgical History:  Procedure Laterality Date   ANKLE SURGERY  2003   LEG SURGERY     WISDOM TOOTH EXTRACTION     Family History  Adopted: Yes   Social History   Socioeconomic History   Marital status: Married    Spouse name: Primary school teacher   Number of children: 0   Years of education: 14   Highest education level: Associate degree: occupational, Scientist, product/process development, or vocational program  Occupational History   Occupation: Associate Professor  Tobacco Use   Smoking status: Former    Average packs/day: 0.5 packs/day for 7.0 years (3.5 ttl pk-yrs)    Types: Cigarettes    Start date: 2013   Smokeless tobacco: Never   Tobacco comments:    socially   Vaping Use   Vaping status: Never Used  Substance and Sexual Activity   Alcohol use: Yes    Alcohol/week: 0.0 standard drinks of alcohol    Comment: socially   Drug use: No   Sexual activity: Yes  Other Topics Concern   Not on file  Social History Narrative   Not on file   Social  Drivers of Health   Financial Resource Strain: Low Risk  (12/13/2023)   Overall Financial Resource Strain (CARDIA)    Difficulty of Paying Living Expenses: Not hard at all  Food Insecurity: No Food Insecurity (12/13/2023)   Hunger Vital Sign    Worried About Running Out of Food in the Last Year: Never true    Ran Out of Food in the Last Year: Never true  Recent Concern: Food Insecurity - Food Insecurity Present (10/11/2023)   Hunger Vital Sign    Worried About Running Out of Food in the Last Year: Sometimes true    Ran Out of Food in the Last Year: Never true  Transportation Needs: No Transportation Needs (12/13/2023)   PRAPARE - Administrator, Civil Service (Medical): No    Lack of Transportation (Non-Medical): No  Physical Activity: Insufficiently Active (12/13/2023)   Exercise Vital Sign    Days of Exercise per Week: 1 day    Minutes of Exercise per Session: 30 min  Stress: Stress Concern Present (12/13/2023)   [employer] of Occupational Health - Occupational Stress Questionnaire    Feeling of Stress : To some extent  Social Connections: Socially Integrated (12/13/2023)   Social Connection and Isolation Panel [NHANES]    Frequency of Communication with Friends and Family: More than  three times a week    Frequency of Social Gatherings with Friends and Family: Once a week    Attends Religious Services: More than 4 times per year    Active Member of [employer] or Organizations: No    Attends Engineer, structural: 1 to 4 times per year    Marital Status: Married     Review of Systems  Constitutional:  Positive for fatigue.       Question of fever.   HENT:  Positive for congestion, postnasal drip and sore throat.   Respiratory:  Positive for cough. Negative for chest tightness and shortness of breath.   Cardiovascular:  Negative for chest pain and palpitations.  Gastrointestinal:  Negative for abdominal pain, diarrhea, nausea and vomiting.  Genitourinary:   Negative for difficulty urinating and dysuria.  Musculoskeletal:  Negative for joint swelling.       Body aches.   Skin:  Negative for color change and rash.  Neurological:  Negative for dizziness and headaches.  Psychiatric/Behavioral:  Negative for agitation and dysphoric mood.        Objective:     BP 130/78   Pulse 89   Temp 98 F (36.7 C)   Resp 16   Ht 6' (1.829 m)   Wt 273 lb (123.8 kg)   SpO2 98%   BMI 37.03 kg/m  Wt Readings from Last 3 Encounters:  12/14/23 273 lb (123.8 kg)  10/17/23 273 lb (123.8 kg)  08/26/23 273 lb (123.8 kg)    Physical Exam Vitals reviewed.  Constitutional:      General: He is not in acute distress.    Appearance: Normal appearance. He is well-developed.  HENT:     Head: Normocephalic and atraumatic.     Right Ear: External ear normal.     Left Ear: External ear normal.  Eyes:     General: No scleral icterus.       Right eye: No discharge.        Left eye: No discharge.     Conjunctiva/sclera: Conjunctivae normal.  Cardiovascular:     Rate and Rhythm: Normal rate and regular rhythm.  Pulmonary:     Effort: Pulmonary effort is normal. No respiratory distress.     Breath sounds: Normal breath sounds.  Abdominal:     General: Bowel sounds are normal.     Palpations: Abdomen is soft.     Tenderness: There is no abdominal tenderness.  Musculoskeletal:        General: No swelling or tenderness.     Cervical back: Neck supple. No tenderness.  Lymphadenopathy:     Cervical: No cervical adenopathy.  Skin:    Findings: No erythema or rash.  Neurological:     Mental Status: He is alert.  Psychiatric:        Mood and Affect: Mood normal.        Behavior: Behavior normal.         Outpatient Encounter Medications as of 12/14/2023  Medication Sig   amoxicillin-clavulanate (AUGMENTIN) 875-125 MG tablet Take 1 tablet by mouth 2 (two) times daily.   predniSONE (DELTASONE) 10 MG tablet Take 4 tablets x 2 day and then decrease by 1/2  tablet per day until down to zero mg.   esomeprazole (NEXIUM) 20 MG packet Take 20 mg by mouth daily before breakfast.   sildenafil (VIAGRA) 50 MG tablet Take 1 tablet (50 mg total) by mouth daily as needed for erectile dysfunction.   valsartan (DIOVAN) 160  MG tablet TAKE ONE TABLET BY MOUTH ONCE DAILY   No facility-administered encounter medications on file as of 12/14/2023.     Lab Results  Component Value Date   WBC 8.8 02/08/2023   HGB 15.9 02/08/2023   HCT 46.4 02/08/2023   PLT 250.0 02/08/2023   GLUCOSE 101 (H) 12/14/2023   CHOL 103 12/14/2023   TRIG 104.0 12/14/2023   HDL 42.50 12/14/2023   LDLCALC 40 12/14/2023   ALT 25 12/14/2023   AST 17 12/14/2023   NA 138 12/14/2023   K 4.0 12/14/2023   CL 104 12/14/2023   CREATININE 1.01 12/14/2023   BUN 11 12/14/2023   CO2 27 12/14/2023   TSH 1.07 01/06/2023   HGBA1C 5.5 01/29/2016    DG Chest 2 View Result Date: 02/20/2021 CLINICAL DATA:  Cough. EXAM: CHEST - 2 VIEW COMPARISON:  None. FINDINGS: The heart size and mediastinal contours are within normal limits. Both lungs are clear. No visible pleural effusions or pneumothorax. No acute osseous abnormality. IMPRESSION: No active cardiopulmonary disease. Electronically Signed   By: Feliberto Harts MD   On: 02/20/2021 11:12       Assessment & Plan:  Primary hypertension Assessment & Plan: Continue valsartan 160mg  q day. Blood pressures have been trending down. Follow. Hold on changes in medication.   Orders: -     Basic metabolic panel -     Hepatic function panel -     Lipid panel  Acute cough -     COVID-19, Flu A+B and RSV; Future  Acute viral syndrome Assessment & Plan: With increased cough and congestion as outlined. No sob. Nasal swab for RSV, covid and flu. Treat with saline nasal spray and steroid nasal spray as outlined. Prednisone taper. Rx for augmention given to hold. If symptoms worsen, can start. Rest. Fluids. Out of work note. Call with update.    Low  back pain, unspecified back pain laterality, unspecified chronicity, unspecified whether sciatica present Assessment & Plan: Low back pain after overextension. Continue tylenol. Follow. No radicular symptoms. Stretches. Notify if persistent.    Gastroesophageal reflux disease, unspecified whether esophagitis present Assessment & Plan: Continue nexium.    Moderate obstructive sleep apnea Assessment & Plan: Continue cpap.    Stress Assessment & Plan: Overall appears to be handling things relatively well.  Follow.     Other orders -     predniSONE; Take 4 tablets x 2 day and then decrease by 1/2 tablet per day until down to zero mg.  Dispense: 18 tablet; Refill: 0 -     Amoxicillin-Pot Clavulanate; Take 1 tablet by mouth 2 (two) times daily.  Dispense: 14 tablet; Refill: 0     Dale Sheldon, MD

## 2023-12-14 NOTE — Patient Instructions (Signed)
Saline nasal spray - flush nose 1-2x/day  Nasacort nasal spray - 2 sprays each nostril one time per day.  Do this in the evening.

## 2023-12-15 ENCOUNTER — Encounter: Payer: Self-pay | Admitting: Internal Medicine

## 2023-12-16 LAB — COVID-19, FLU A+B AND RSV
Influenza A, NAA: NOT DETECTED
Influenza B, NAA: NOT DETECTED
RSV, NAA: NOT DETECTED
SARS-CoV-2, NAA: DETECTED — AB

## 2023-12-18 ENCOUNTER — Encounter: Payer: Self-pay | Admitting: Internal Medicine

## 2023-12-18 DIAGNOSIS — B349 Viral infection, unspecified: Secondary | ICD-10-CM | POA: Insufficient documentation

## 2023-12-18 NOTE — Assessment & Plan Note (Signed)
Continue valsartan 160mg  q day. Blood pressures have been trending down. Follow. Hold on changes in medication.

## 2023-12-18 NOTE — Assessment & Plan Note (Signed)
With increased cough and congestion as outlined. No sob. Nasal swab for RSV, covid and flu. Treat with saline nasal spray and steroid nasal spray as outlined. Prednisone taper. Rx for augmention given to hold. If symptoms worsen, can start. Rest. Fluids. Out of work note. Call with update.

## 2023-12-18 NOTE — Assessment & Plan Note (Signed)
 Continue cpap.

## 2023-12-18 NOTE — Assessment & Plan Note (Signed)
 Continue nexium

## 2023-12-18 NOTE — Assessment & Plan Note (Signed)
Low back pain after overextension. Continue tylenol. Follow. No radicular symptoms. Stretches. Notify if persistent.

## 2023-12-18 NOTE — Assessment & Plan Note (Signed)
Overall appears to be handling things relatively well.  Follow.   

## 2024-02-05 ENCOUNTER — Other Ambulatory Visit: Payer: Self-pay | Admitting: Internal Medicine

## 2024-02-07 NOTE — Telephone Encounter (Signed)
 He's having some more leaks recently but still well controlled. Has he changed his mask out?

## 2024-02-13 NOTE — Telephone Encounter (Signed)
 Please schedule appt to discuss. Ok to do virtual. Thanks!

## 2024-02-28 ENCOUNTER — Telehealth: Payer: 59 | Admitting: Internal Medicine

## 2024-02-28 DIAGNOSIS — M542 Cervicalgia: Secondary | ICD-10-CM | POA: Diagnosis not present

## 2024-02-28 DIAGNOSIS — I10 Essential (primary) hypertension: Secondary | ICD-10-CM | POA: Diagnosis not present

## 2024-02-28 DIAGNOSIS — K219 Gastro-esophageal reflux disease without esophagitis: Secondary | ICD-10-CM

## 2024-02-28 DIAGNOSIS — G4733 Obstructive sleep apnea (adult) (pediatric): Secondary | ICD-10-CM

## 2024-02-28 NOTE — Progress Notes (Signed)
 Patient ID: Ian Pineda, male   DOB: 01-17-1986, 38 y.o.   MRN: [id number]   Virtual Visit via video Note  I connected with Ian Pineda by a video enabled telemedicine application and verified that I am speaking with the correct person using two identifiers. Location patient: home Location provider: work  Persons participating in the virtual visit: patient, provider  The limitations, risks, security and privacy concerns of performing an evaluation and management service by video and the availability of in person appointments have been discussed. It has also been discussed with the patient that there may be a patient responsible charge related to this service. The patient expressed understanding and agreed to proceed.   Reason for visit: scheduled follow up   HPI: Here for a scheduled follow up - follow up regarding hypertension. On valsartan  160mg  q day. Using cpap. Continues on nexium . Using cpap. Averaging 3-4 hours of sleep per night. New machine - 12/2023. Does not feel he is tolerating the machine. Feels fighting the machine at times. Discussed need for f/u with pulmonary. Reports - neck discomfort. Neck stiff when awakens. Some change in sensation - down left arm. Has tried a new mattress and new pillow. No chest pain reported. Breathing stable. States blood pressure - 140/85.    ROS: See pertinent positives and negatives per HPI.  Past Medical History:  Diagnosis Date   Back pain    GERD (gastroesophageal reflux disease)    Syncope    one episode worked up (cardiology and endocrinology).  felt to be related to hypoglycemia    Past Surgical History:  Procedure Laterality Date   ANKLE SURGERY  2003   LEG SURGERY     WISDOM TOOTH EXTRACTION      Family History  Adopted: Yes    SOCIAL HX: reviewed.    Current Outpatient Medications:    esomeprazole  (NEXIUM ) 20 MG packet, Take 20 mg by mouth daily before breakfast., Disp: , Rfl:    sildenafil  (VIAGRA ) 50 MG tablet,  Take 1 tablet (50 mg total) by mouth daily as needed for erectile dysfunction., Disp: 30 tablet, Rfl: 0   valsartan  (DIOVAN ) 160 MG tablet, TAKE ONE TABLET BY MOUTH ONCE DAILY, Disp: 30 tablet, Rfl: 1  EXAM:  GENERAL: alert, oriented, appears well and in no acute distress  HEENT: atraumatic, conjunttiva clear, no obvious abnormalities on inspection of external nose and ears  NECK: normal movements of the head and neck  LUNGS: on inspection no signs of respiratory distress, breathing rate appears normal, no obvious gross SOB, gasping or wheezing  CV: no obvious cyanosis  PSYCH/NEURO: pleasant and cooperative, no obvious depression or anxiety, speech and thought processing grossly intact  ASSESSMENT AND PLAN:  Discussed the following assessment and plan:  Problem List Items Addressed This Visit     GERD (gastroesophageal reflux disease) - Primary   Continue nexium .       Moderate obstructive sleep apnea   Using cpap. Not tolerating. Discussed need for f/u with pulmonary. Discussed treating sleep apnea - will help with blood pressure. Follow.       Neck pain   Neck stiffness/left arm/hand issues. Discussed further w/up and evaluation. Consider NCS. Wants to monitor.       Primary hypertension   Continue valsartan . On 150mg  q day. Blood pressure as outlined. Treat sleep apnea. Follow pressures. If remains elevated, will adjust medication.        Return in about 2 months (around 04/29/2024) for follow-up.   I  discussed the assessment and treatment plan with the patient. The patient was provided an opportunity to ask questions and all were answered. The patient agreed with the plan and demonstrated an understanding of the instructions.   The patient was advised to call back or seek an in-person evaluation if the symptoms worsen or if the condition fails to improve as anticipated.    Dellar Fenton, MD

## 2024-03-03 ENCOUNTER — Encounter: Payer: Self-pay | Admitting: Internal Medicine

## 2024-03-03 DIAGNOSIS — M542 Cervicalgia: Secondary | ICD-10-CM | POA: Insufficient documentation

## 2024-03-03 NOTE — Assessment & Plan Note (Signed)
 Continue valsartan . On 150mg  q day. Blood pressure as outlined. Treat sleep apnea. Follow pressures. If remains elevated, will adjust medication.

## 2024-03-03 NOTE — Assessment & Plan Note (Signed)
 Continue nexium

## 2024-03-03 NOTE — Assessment & Plan Note (Signed)
 Using cpap. Not tolerating. Discussed need for f/u with pulmonary. Discussed treating sleep apnea - will help with blood pressure. Follow.

## 2024-03-03 NOTE — Assessment & Plan Note (Signed)
 Neck stiffness/left arm/hand issues. Discussed further w/up and evaluation. Consider NCS. Wants to monitor.

## 2024-03-28 ENCOUNTER — Telehealth: Payer: Self-pay | Admitting: Internal Medicine

## 2024-03-28 NOTE — Telephone Encounter (Signed)
 I had seen Ian Pineda recently for a scheduled follow up. He had reported problems with his new cpap machine. Not tolerating. In reviewing pulmonary's response, please notify that he needs to call and schedule a f/u appt with them - for further evaluation.

## 2024-04-06 ENCOUNTER — Encounter: Payer: Self-pay | Admitting: Nurse Practitioner

## 2024-04-06 ENCOUNTER — Telehealth: Admitting: Nurse Practitioner

## 2024-04-06 DIAGNOSIS — F5101 Primary insomnia: Secondary | ICD-10-CM | POA: Diagnosis not present

## 2024-04-06 DIAGNOSIS — G4733 Obstructive sleep apnea (adult) (pediatric): Secondary | ICD-10-CM | POA: Diagnosis not present

## 2024-04-06 MED ORDER — ZOLPIDEM TARTRATE 5 MG PO TABS: 5.0000 mg | ORAL_TABLET | Freq: Every evening | ORAL | 1 refills | Status: DC | PRN

## 2024-04-06 NOTE — Progress Notes (Signed)
 Patient ID: Ian Pineda, male     DOB: 31-Mar-1986, 38 y.o.      MRN: [id number]  No chief complaint on file.   Virtual Visit via Video Note  I connected with Ian Pineda on 04/06/24 at  2:30 PM EDT by a video enabled telemedicine application and verified that I am speaking with the correct person using two identifiers.  Location: Patient: Work Provider: Home   I discussed the limitations of evaluation and management by telemedicine and the availability of in person appointments. The patient expressed understanding and agreed to proceed.  History of Present Illness: 38 year old male, former smoker followed for OSA on CPAP. Past medical history significant for GERD, ED.    TEST/EVENTS:  06/22/2023 HST: AHI 15.7/h, SpO2 low 87%  06/15/2023: OV with Ian Pineda for sleep consult, referred by Dr. Geralyn Pineda. He has been having trouble with his sleep for many years. Noticed it more after his son was born in 2019. He started sleeping a little bit better as he got older but then his daughter was born last year and he feels like his sleep is even more restless. He's always had night awakenings but he feels like some nights, he just stares at the clock. He feels groggy in the morning and is tired during the day. Restless sleep at night; waking up often. His wife tells him he snores and stops breathing when he sleeps. He does tend to wake up with a dry mouth. Denies any morning headaches, drowsy driving, sleep parasomnias/paralysis.  He goes to bed between 9-10 pm. Can take him hours to fall asleep. He will occasionally use z quil which helps. Has tried melatonin but it didn't keep him asleep for long. Wakes 3-4 times a night. Gets up between 615-630 am. 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 Weight is up 10 lb over the last two years. He had a sleep study over 10 years ago but does not remember where or what the results were. Never been on CPAP. No O2 use.  No history of cardiac disease,  stroke, diabetes. He is a former smoker; 3.5 pack year history. Quit 2020. Drinks a few alcoholic drinks on the weekends. He drinks energy drinks during the day to help keep him awake. He lives with his wife and 2 kids. Works as a Chartered certified accountant. Family history of emphysema, heart disease, cancer. [place] 15  07/27/2023: OV with Ian Pineda for follow up to discuss home sleep study results which revealed moderate sleep apnea. Feels unchanged compared to his last visit. Has a lot of trouble with falling and staying asleep. Always tired. Denies drowsy driving, sleep parasomnias/paralysis.   09/14/2023: OV with Ian Pineda for follow up after starting on CPAP. He has been doing well with this. He feels like his sleep quality is much better. Better rested during the day. Still has some shorter periods of sleep, related to his kids waking him up. He did have some trouble with viral illness and his nose feeling stopped up. The mask was hard to wear with this. He had reached out to the DME for a full face but never heard back. Currently using nasal pillow mask. Pressures feel appropriate. No bothersome leaks. 08/15/2023-09/13/2023: CPAP 5-15 cmH2O 30/30 days; 83% >4 hours; average use 6 hours 29 minutes Pressure 95th 8.2 Leaks 95th 22.4 AHI 1.4  04/06/2024: Today - follow up Patient presents today for follow-up regarding CPAP therapy.  He wears his CPAP most  nights.  Settle down with a nasal pillow mask.  Does still occasionally feel like he has some bothersome leaks but overall is doing better.  Unfortunately, he continues to have disruptions in his sleep at night.  Usually awake after an hour and then has difficulties going back to sleep.  Tends to still wake up frequently which has caused him to still have some daytime tiredness.  He does feel better than when he was not using his CPAP.  No issues with sleep parasomnia/paralysis.  No trouble with drowsy driving.  Has tried over-the-counter sleep aids in the past without  much success.  No issues with mood at the moment.  03/06/2024-04/04/2024: CPAP 5-15 cmH2O 30/30 days; 87% >4 hr; average use 6 hours 36 minutes Pressure 95th 7.7 leaks 95th 21.1 AHI 1.4  No Known Allergies Immunization History  Administered Date(s) Administered   Influenza Split 08/19/2012, 08/20/2013, 09/24/2014   Influenza, Quadrivalent, Recombinant, Inj, Pf 09/26/2019   Influenza, Seasonal, Injecte, Preservative Fre 08/08/2023   Influenza,inj,Quad PF,6+ Mos 09/20/2018, 08/26/2021   Influenza-Unspecified 09/08/2015, 08/15/2020, 08/15/2022   Tdap 01/29/2016   Past Medical History:  Diagnosis Date   Back pain    GERD (gastroesophageal reflux disease)    Syncope    one episode worked up (cardiology and endocrinology).  felt to be related to hypoglycemia    Tobacco History: Social History   Tobacco Use  Smoking Status Former   Average packs/day: 0.5 packs/day for 7.0 years (3.5 ttl pk-yrs)   Types: Cigarettes   Start date: 2013  Smokeless Tobacco Never  Tobacco Comments   socially    Counseling given: Not Answered Tobacco comments: socially    Outpatient Medications Prior to Visit  Medication Sig Dispense Refill   esomeprazole  (NEXIUM ) 20 MG packet Take 20 mg by mouth daily before breakfast.     sildenafil  (VIAGRA ) 50 MG tablet Take 1 tablet (50 mg total) by mouth daily as needed for erectile dysfunction. 30 tablet 0   valsartan  (DIOVAN ) 160 MG tablet TAKE ONE TABLET BY MOUTH ONCE DAILY 30 tablet 1   No facility-administered medications prior to visit.     Review of Systems:   Constitution: No weight change, fevers, chills. +fatigue (improved) HEENT: No headaches, difficulty swallowing, tooth/dental problems, or sore throat. No sneezing, itching, ear ache, or post nasal drip. +nasal congestion  CV:  No chest pain, orthopnea, PND, swelling in lower extremities, anasarca, dizziness, palpitations, syncope Resp: +snoring (without CPAP); shortness of breath with  exertion (baseline). No excess mucus or change in color of mucus. No productive or non-productive. No hemoptysis. No wheezing.   GI:  No nocturia  GU: No nocturia Skin: No rash, lesions, ulcerations MSK:  No joint pain or swelling.   Neuro: No dizziness or lightheadedness.  Psych: No depression or anxiety. Mood stable. +sleep disturbance  Observations/Objective: Patient is well-developed, well-nourished in no acute distress. A&Ox3. Resting comfortably at home. Unlabored breathing. Speech is clear and coherent with logical content.   Assessment and Plan: Moderate obstructive sleep apnea Moderate OSA on CPAP.  Good compliance and excellent control.  He does receive benefit from use.  Moderate leaks that can be bothersome. Will tighten his settings to 6-9 cmH2O and reassess response. Aware of risks of untreated sleep apnea.  Encouraged to continue using nightly.  Understands proper use/care device.  Safe driving practices reviewed.  Healthy weight loss encouraged.   Unfortunately still has sleep disruptions causing him to feel poorly refreshed.  Will address insomnia with pharmacological therapy.  Patient Instructions  Continue to use CPAP every night, minimum of 4-6 hours a night.  Change equipment as directed. Wash your tubing with warm soap and water daily, hang to dry. Wash humidifier portion weekly. Use bottled, distilled water and change daily Be aware of reduced alertness and do not drive or operate heavy machinery if experiencing this or drowsiness.  Exercise encouraged, as tolerated. Healthy weight management discussed.  Avoid or decrease alcohol consumption and medications that make you more sleepy, if possible. Notify if persistent daytime sleepiness occurs even with consistent use of PAP therapy.  Adjust settings to 6-9 cmH2O  Start zolpidem (Ambien) 5 mg At bedtime as needed for sleep. Take immediately before bed. Ensure you have 7-8 hours in the bed after taking. Monitor for any  mood changes or changes in sleep habits. Stop and notify immediately if these occur. Do not drive or operate heavy machinery after taking. Do not take with alcohol or other sedating medications. Ensure you apply your CPAP within 5-10 minutes of taking to avoid falling asleep without it. May cause some morning grogginess or vivid dreams.   Follow up 7/3 at 1:30 pm via virtual visit with Katie Kalecia Hartney,Pineda. If symptoms do not improve or worsen, please contact office for sooner follow up or seek emergency care.    Insomnia See above. Sleep hygiene reviewed. Discussed risks of chronic sleep deprivation. Shared decision to trial sleep aid. He has no history of sleep parasomnias and no difficulties with his mood. Will start zolpidem 5 mg At bedtime PRN sleep. Safety profile reviewed.      I discussed the assessment and treatment plan with the patient. The patient was provided an opportunity to ask questions and all were answered. The patient agreed with the plan and demonstrated an understanding of the instructions.   The patient was advised to call back or seek an in-person evaluation if the symptoms worsen or if the condition fails to improve as anticipated.  I provided 31 minutes of non-face-to-face time during this encounter.   Roetta Clarke, Pineda

## 2024-04-06 NOTE — Patient Instructions (Addendum)
 Continue to use CPAP every night, minimum of 4-6 hours a night.  Change equipment as directed. Wash your tubing with warm soap and water daily, hang to dry. Wash humidifier portion weekly. Use bottled, distilled water and change daily Be aware of reduced alertness and do not drive or operate heavy machinery if experiencing this or drowsiness.  Exercise encouraged, as tolerated. Healthy weight management discussed.  Avoid or decrease alcohol consumption and medications that make you more sleepy, if possible. Notify if persistent daytime sleepiness occurs even with consistent use of PAP therapy.  Adjust settings to 6-9 cmH2O  Start zolpidem  (Ambien ) 5 mg At bedtime as needed for sleep. Take immediately before bed. Ensure you have 7-8 hours in the bed after taking. Monitor for any mood changes or changes in sleep habits. Stop and notify immediately if these occur. Do not drive or operate heavy machinery after taking. Do not take with alcohol or other sedating medications. Ensure you apply your CPAP within 5-10 minutes of taking to avoid falling asleep without it. May cause some morning grogginess or vivid dreams.   Follow up 7/3 at 1:30 pm via virtual visit with Katie Saraiya Kozma,NP. If symptoms do not improve or worsen, please contact office for sooner follow up or seek emergency care.

## 2024-04-06 NOTE — Assessment & Plan Note (Signed)
 See above. Sleep hygiene reviewed. Discussed risks of chronic sleep deprivation. Shared decision to trial sleep aid. He has no history of sleep parasomnias and no difficulties with his mood. Will start zolpidem 5 mg At bedtime PRN sleep. Safety profile reviewed.

## 2024-04-06 NOTE — Assessment & Plan Note (Signed)
 Moderate OSA on CPAP.  Good compliance and excellent control.  He does receive benefit from use.  Moderate leaks that can be bothersome. Will tighten his settings to 6-9 cmH2O and reassess response. Aware of risks of untreated sleep apnea.  Encouraged to continue using nightly.  Understands proper use/care device.  Safe driving practices reviewed.  Healthy weight loss encouraged.   Unfortunately still has sleep disruptions causing him to feel poorly refreshed.  Will address insomnia with pharmacological therapy.  Patient Instructions  Continue to use CPAP every night, minimum of 4-6 hours a night.  Change equipment as directed. Wash your tubing with warm soap and water daily, hang to dry. Wash humidifier portion weekly. Use bottled, distilled water and change daily Be aware of reduced alertness and do not drive or operate heavy machinery if experiencing this or drowsiness.  Exercise encouraged, as tolerated. Healthy weight management discussed.  Avoid or decrease alcohol consumption and medications that make you more sleepy, if possible. Notify if persistent daytime sleepiness occurs even with consistent use of PAP therapy.  Adjust settings to 6-9 cmH2O  Start zolpidem (Ambien) 5 mg At bedtime as needed for sleep. Take immediately before bed. Ensure you have 7-8 hours in the bed after taking. Monitor for any mood changes or changes in sleep habits. Stop and notify immediately if these occur. Do not drive or operate heavy machinery after taking. Do not take with alcohol or other sedating medications. Ensure you apply your CPAP within 5-10 minutes of taking to avoid falling asleep without it. May cause some morning grogginess or vivid dreams.   Follow up 7/3 at 1:30 pm via virtual visit with Katie Zai Chmiel,NP. If symptoms do not improve or worsen, please contact office for sooner follow up or seek emergency care.

## 2024-04-07 ENCOUNTER — Other Ambulatory Visit: Payer: Self-pay | Admitting: Internal Medicine

## 2024-05-09 ENCOUNTER — Ambulatory Visit: Admitting: Internal Medicine

## 2024-05-09 VITALS — BP 120/72 | HR 90 | Temp 98.0°F | Resp 16 | Ht 72.0 in | Wt 286.0 lb

## 2024-05-09 DIAGNOSIS — G4733 Obstructive sleep apnea (adult) (pediatric): Secondary | ICD-10-CM

## 2024-05-09 DIAGNOSIS — G4700 Insomnia, unspecified: Secondary | ICD-10-CM

## 2024-05-09 DIAGNOSIS — K219 Gastro-esophageal reflux disease without esophagitis: Secondary | ICD-10-CM | POA: Diagnosis not present

## 2024-05-09 DIAGNOSIS — R200 Anesthesia of skin: Secondary | ICD-10-CM

## 2024-05-09 DIAGNOSIS — I10 Essential (primary) hypertension: Secondary | ICD-10-CM

## 2024-05-09 DIAGNOSIS — F439 Reaction to severe stress, unspecified: Secondary | ICD-10-CM

## 2024-05-09 LAB — LIPID PANEL
Cholesterol: 135 mg/dL (ref 0–200)
HDL: 46.3 mg/dL (ref >39.00)
LDL Cholesterol: 65 mg/dL (ref 0–99)
NonHDL: 88.58
Total CHOL/HDL Ratio: 3
Triglycerides: 117 mg/dL (ref 0.0–149.0)
VLDL: 23.4 mg/dL (ref 0.0–40.0)

## 2024-05-09 LAB — BASIC METABOLIC PANEL WITH GFR
BUN: 13 mg/dL (ref 6–23)
CO2: 27 meq/L (ref 19–32)
Calcium: 9.1 mg/dL (ref 8.4–10.5)
Chloride: 103 meq/L (ref 96–112)
Creatinine, Ser: 0.98 mg/dL (ref 0.40–1.50)
GFR: 99.54 mL/min
Glucose, Bld: 107 mg/dL — ABNORMAL HIGH (ref 70–99)
Potassium: 4 meq/L (ref 3.5–5.1)
Sodium: 138 meq/L (ref 135–145)

## 2024-05-09 LAB — CBC WITH DIFFERENTIAL/PLATELET
Basophils Absolute: 0.1 10*3/uL (ref 0.0–0.1)
Basophils Relative: 0.8 % (ref 0.0–3.0)
Eosinophils Absolute: 0.2 10*3/uL (ref 0.0–0.7)
Eosinophils Relative: 2.6 % (ref 0.0–5.0)
HCT: 45.2 % (ref 39.0–52.0)
Hemoglobin: 15.3 g/dL (ref 13.0–17.0)
Lymphocytes Relative: 21.3 % (ref 12.0–46.0)
Lymphs Abs: 1.6 10*3/uL (ref 0.7–4.0)
MCHC: 33.8 g/dL (ref 30.0–36.0)
MCV: 86 fl (ref 78.0–100.0)
Monocytes Absolute: 0.5 10*3/uL (ref 0.1–1.0)
Monocytes Relative: 7.1 % (ref 3.0–12.0)
Neutro Abs: 5.2 10*3/uL (ref 1.4–7.7)
Neutrophils Relative %: 68.2 % (ref 43.0–77.0)
Platelets: 243 10*3/uL (ref 150.0–400.0)
RBC: 5.25 Mil/uL (ref 4.22–5.81)
RDW: 13.2 % (ref 11.5–15.5)
WBC: 7.6 10*3/uL (ref 4.0–10.5)

## 2024-05-09 LAB — HEPATIC FUNCTION PANEL
ALT: 32 U/L (ref 0–53)
AST: 22 U/L (ref 0–37)
Albumin: 4.3 g/dL (ref 3.5–5.2)
Alkaline Phosphatase: 54 U/L (ref 39–117)
Bilirubin, Direct: 0.1 mg/dL (ref 0.0–0.3)
Total Bilirubin: 0.4 mg/dL (ref 0.2–1.2)
Total Protein: 6.9 g/dL (ref 6.0–8.3)

## 2024-05-09 LAB — TSH: TSH: 1.18 u[IU]/mL (ref 0.35–5.50)

## 2024-05-09 MED ORDER — TRAZODONE HCL 50 MG PO TABS: ORAL_TABLET | ORAL | 1 refills | Status: DC

## 2024-05-09 NOTE — Assessment & Plan Note (Signed)
 Persistent issues. Using cpap regularly. Hesitant to use ambien  due to possible side effects. Has tried melatonin and other otc medications. Discussed other treatment options. Trial of trazodone. Follow.

## 2024-05-09 NOTE — Assessment & Plan Note (Signed)
Overall appears to be handling things relatively well.  Discussed.  Follow.

## 2024-05-09 NOTE — Progress Notes (Signed)
 Subjective:    Patient ID: Ian Pineda, male    DOB: 1986-05-07, 38 y.o.   MRN: [id number]  Patient here for  Chief Complaint  Patient presents with   Medical Management of Chronic Issues    HPI Here for a scheduled follow up - follow up regarding hypertension. On valsartan  160mg  q day. Using cpap. Had f/u with pulmonary 04/06/24 - recommended changing settings with plans to reassess response. Also prescribed ambien  to help with sleep. Planned f/u 05/17/24. He did not take ambien . Concerned regarding side effects. Does want to take something to help with sleep. Has tried melatonin and other otc medications. Breathing stable. Handling stress. Previous neck and arm issues.  Better. Some numbness in the third/fourth and fifth fingers - left hand. No weakness. Discussed. Wants to hold on further intervention or evaluation. Continues on nexium .    Past Medical History:  Diagnosis Date   Back pain    GERD (gastroesophageal reflux disease)    Syncope    one episode worked up (cardiology and endocrinology).  felt to be related to hypoglycemia   Past Surgical History:  Procedure Laterality Date   ANKLE SURGERY  2003   LEG SURGERY     WISDOM TOOTH EXTRACTION     Family History  Adopted: Yes   Social History   Socioeconomic History   Marital status: Married    Spouse name: Primary school teacher   Number of children: 0   Years of education: 14   Highest education level: Associate degree: occupational, Scientist, product/process development, or vocational program  Occupational History   Occupation: Associate Professor  Tobacco Use   Smoking status: Former    Average packs/day: 0.5 packs/day for 7.0 years (3.5 ttl pk-yrs)    Types: Cigarettes    Start date: 2013   Smokeless tobacco: Never   Tobacco comments:    socially   Vaping Use   Vaping status: Never Used  Substance and Sexual Activity   Alcohol use: Yes    Alcohol/week: 0.0 standard drinks of alcohol    Comment: socially   Drug use: No   Sexual activity: Yes   Other Topics Concern   Not on file  Social History Narrative   Not on file   Social Drivers of Health   Financial Resource Strain: Medium Risk (05/09/2024)   Overall Financial Resource Strain (CARDIA)    Difficulty of Paying Living Expenses: Somewhat hard  Food Insecurity: No Food Insecurity (05/09/2024)   Hunger Vital Sign    Worried About Running Out of Food in the Last Year: Never true    Ran Out of Food in the Last Year: Never true  Transportation Needs: No Transportation Needs (05/09/2024)   PRAPARE - Administrator, Civil Service (Medical): No    Lack of Transportation (Non-Medical): No  Physical Activity: Insufficiently Active (05/09/2024)   Exercise Vital Sign    Days of Exercise per Week: 2 days    Minutes of Exercise per Session: 20 min  Stress: No Stress Concern Present (05/09/2024)   [employer] of Occupational Health - Occupational Stress Questionnaire    Feeling of Stress: Only a little  Social Connections: Moderately Integrated (05/09/2024)   Social Connection and Isolation Panel    Frequency of Communication with Friends and Family: More than three times a week    Frequency of Social Gatherings with Friends and Family: Once a week    Attends Religious Services: More than 4 times per year    Active Member  of Clubs or Organizations: No    Attends Banker Meetings: Not on file    Marital Status: Married     Review of Systems  Constitutional:  Negative for appetite change and unexpected weight change.  HENT:  Negative for congestion and sinus pressure.   Respiratory:  Negative for cough, chest tightness and shortness of breath.   Cardiovascular:  Negative for chest pain, palpitations and leg swelling.  Gastrointestinal:  Negative for abdominal pain, diarrhea, nausea and vomiting.  Genitourinary:  Negative for difficulty urinating and dysuria.  Musculoskeletal:  Negative for joint swelling and myalgias.  Skin:  Negative for color change  and rash.  Neurological:  Negative for dizziness and headaches.  Psychiatric/Behavioral:  Negative for agitation and dysphoric mood.        Objective:     BP 120/72   Pulse 90   Temp 98 F (36.7 C)   Resp 16   Ht 6' (1.829 m)   Wt 286 lb (129.7 kg)   SpO2 99%   BMI 38.79 kg/m  Wt Readings from Last 3 Encounters:  05/09/24 286 lb (129.7 kg)  12/14/23 273 lb (123.8 kg)  10/17/23 273 lb (123.8 kg)    Physical Exam Vitals reviewed.  Constitutional:      General: He is not in acute distress.    Appearance: Normal appearance. He is well-developed.  HENT:     Head: Normocephalic and atraumatic.     Right Ear: External ear normal.     Left Ear: External ear normal.     Mouth/Throat:     Pharynx: No oropharyngeal exudate or posterior oropharyngeal erythema.   Eyes:     General: No scleral icterus.       Right eye: No discharge.        Left eye: No discharge.     Conjunctiva/sclera: Conjunctivae normal.    Cardiovascular:     Rate and Rhythm: Normal rate and regular rhythm.  Pulmonary:     Effort: Pulmonary effort is normal. No respiratory distress.     Breath sounds: Normal breath sounds.  Abdominal:     General: Bowel sounds are normal.     Palpations: Abdomen is soft.     Tenderness: There is no abdominal tenderness.   Musculoskeletal:        General: No swelling or tenderness.     Cervical back: Neck supple. No tenderness.     Comments: Grip strength - normal - bilateral  Lymphadenopathy:     Cervical: No cervical adenopathy.   Skin:    Findings: No erythema or rash.   Neurological:     Mental Status: He is alert.   Psychiatric:        Mood and Affect: Mood normal.        Behavior: Behavior normal.         Outpatient Encounter Medications as of 05/09/2024  Medication Sig   traZODone (DESYREL) 50 MG tablet 1/2 - 1 tablet q hs.   esomeprazole  (NEXIUM ) 20 MG packet Take 20 mg by mouth daily before breakfast.   sildenafil  (VIAGRA ) 50 MG tablet Take  1 tablet (50 mg total) by mouth daily as needed for erectile dysfunction.   valsartan  (DIOVAN ) 160 MG tablet TAKE ONE TABLET BY MOUTH ONCE DAILY   [DISCONTINUED] zolpidem  (AMBIEN ) 5 MG tablet Take 1 tablet (5 mg total) by mouth at bedtime as needed for sleep.   No facility-administered encounter medications on file as of 05/09/2024.  Lab Results  Component Value Date   WBC 8.8 02/08/2023   HGB 15.9 02/08/2023   HCT 46.4 02/08/2023   PLT 250.0 02/08/2023   GLUCOSE 101 (H) 12/14/2023   CHOL 103 12/14/2023   TRIG 104.0 12/14/2023   HDL 42.50 12/14/2023   LDLCALC 40 12/14/2023   ALT 25 12/14/2023   AST 17 12/14/2023   NA 138 12/14/2023   K 4.0 12/14/2023   CL 104 12/14/2023   CREATININE 1.01 12/14/2023   BUN 11 12/14/2023   CO2 27 12/14/2023   TSH 1.07 01/06/2023   HGBA1C 5.5 01/29/2016    DG Chest 2 View Result Date: 02/20/2021 CLINICAL DATA:  Cough. EXAM: CHEST - 2 VIEW COMPARISON:  None. FINDINGS: The heart size and mediastinal contours are within normal limits. Both lungs are clear. No visible pleural effusions or pneumothorax. No acute osseous abnormality. IMPRESSION: No active cardiopulmonary disease. Electronically Signed   By: Gilmore GORMAN Molt MD   On: 02/20/2021 11:12       Assessment & Plan:  Primary hypertension Assessment & Plan: Continue valsartan . Blood pressure as outlined. Continue to treat sleep apnea. Follow pressures. No change today. Check metabolic panel today.   Orders: -     CBC with Differential/Platelet -     Basic metabolic panel with GFR -     Hepatic function panel -     Lipid panel -     TSH  Gastroesophageal reflux disease, unspecified whether esophagitis present Assessment & Plan: No upper symptoms reported. On nexium .    Insomnia, unspecified type Assessment & Plan: Persistent issues. Using cpap regularly. Hesitant to use ambien  due to possible side effects. Has tried melatonin and other otc medications. Discussed other treatment  options. Trial of trazodone. Follow.    Moderate obstructive sleep apnea Assessment & Plan: Using cpap regualrly. Does feel benefit with using.    Stress Assessment & Plan: Overall appears to be handling things relatively well. Discussed. Follow.    Numbness of left hand Assessment & Plan: Numbness now - three fingers as outlined. No increased pain. Symptoms improved. Discussed further w/up and evalaution. Wants to monitor.    Other orders -     traZODone HCl; 1/2 - 1 tablet q hs.  Dispense: 30 tablet; Refill: 1     Allena Hamilton, MD

## 2024-05-09 NOTE — Assessment & Plan Note (Signed)
 Continue valsartan . Blood pressure as outlined. Continue to treat sleep apnea. Follow pressures. No change today. Check metabolic panel today.

## 2024-05-09 NOTE — Assessment & Plan Note (Signed)
 Numbness now - three fingers as outlined. No increased pain. Symptoms improved. Discussed further w/up and evalaution. Wants to monitor.

## 2024-05-09 NOTE — Assessment & Plan Note (Signed)
No upper symptoms reported.  On nexium.   

## 2024-05-09 NOTE — Assessment & Plan Note (Signed)
 Using cpap regualrly. Does feel benefit with using.

## 2024-05-10 ENCOUNTER — Ambulatory Visit: Payer: Self-pay | Admitting: Internal Medicine

## 2024-05-17 ENCOUNTER — Ambulatory Visit: Admitting: Nurse Practitioner

## 2024-06-23 ENCOUNTER — Other Ambulatory Visit: Payer: Self-pay | Admitting: Internal Medicine

## 2024-06-25 ENCOUNTER — Telehealth: Admitting: Nurse Practitioner

## 2024-06-25 ENCOUNTER — Encounter: Payer: Self-pay | Admitting: Nurse Practitioner

## 2024-06-25 VITALS — BP 130/75 | Ht 72.0 in | Wt 275.0 lb

## 2024-06-25 DIAGNOSIS — F5101 Primary insomnia: Secondary | ICD-10-CM

## 2024-06-25 DIAGNOSIS — G4733 Obstructive sleep apnea (adult) (pediatric): Secondary | ICD-10-CM | POA: Diagnosis not present

## 2024-06-25 NOTE — Progress Notes (Signed)
 Patient ID: Ian Pineda, male     DOB: Mar 11, 1986, 38 y.o.      MRN: [id number]  Chief Complaint  Patient presents with   Follow-up    Wearing CPAP nightly. Wakes up gasping for air once a week.     Virtual Visit via Video Note  I connected with KINDRED REIDINGER on 06/26/24 at  3:00 PM EDT by a video enabled telemedicine application and verified that I am speaking with the correct person using two identifiers.  Location: Patient: Work Provider: Work   I discussed the limitations of evaluation and management by telemedicine and the availability of in person appointments. The patient expressed understanding and agreed to proceed.  History of Present Illness: 38 year old male, former smoker followed for OSA on CPAP and insomnia. Past medical history significant for GERD, ED.    TEST/EVENTS:  06/22/2023 HST: AHI 15.7/h, SpO2 low 87%  06/15/2023: OV with Keydi Giel NP for sleep consult, referred by Dr. Glendia. He has been having trouble with his sleep for many years. Noticed it more after his son was born in 2019. He started sleeping a little bit better as he got older but then his daughter was born last year and he feels like his sleep is even more restless. He's always had night awakenings but he feels like some nights, he just stares at the clock. He feels groggy in the morning and is tired during the day. Restless sleep at night; waking up often. His wife tells him he snores and stops breathing when he sleeps. He does tend to wake up with a dry mouth. Denies any morning headaches, drowsy driving, sleep parasomnias/paralysis.  He goes to bed between 9-10 pm. Can take him hours to fall asleep. He will occasionally use z quil which helps. Has tried melatonin but it didn't keep him asleep for long. Wakes 3-4 times a night. Gets up between 615-630 am. 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 Weight is up 10 lb over the last two years. He had a sleep study over 10 years ago but does not  remember where or what the results were. Never been on CPAP. No O2 use.  No history of cardiac disease, stroke, diabetes. He is a former smoker; 3.5 pack year history. Quit 2020. Drinks a few alcoholic drinks on the weekends. He drinks energy drinks during the day to help keep him awake. He lives with his wife and 2 kids. Works as a Chartered certified accountant. Family history of emphysema, heart disease, cancer. Epworth 15  07/27/2023: OV with Sanaii Caporaso NP for follow up to discuss home sleep study results which revealed moderate sleep apnea. Feels unchanged compared to his last visit. Has a lot of trouble with falling and staying asleep. Always tired. Denies drowsy driving, sleep parasomnias/paralysis.   09/14/2023: OV with Kelley Knoth NP for follow up after starting on CPAP. He has been doing well with this. He feels like his sleep quality is much better. Better rested during the day. Still has some shorter periods of sleep, related to his kids waking him up. He did have some trouble with viral illness and his nose feeling stopped up. The mask was hard to wear with this. He had reached out to the DME for a full face but never heard back. Currently using nasal pillow mask. Pressures feel appropriate. No bothersome leaks. 08/15/2023-09/13/2023: CPAP 5-15 cmH2O 30/30 days; 83% >4 hours; average use 6 hours 29 minutes Pressure 95th 8.2 Leaks 95th 22.4  AHI 1.4  04/06/2024: Ov with Saleen Peden NP for follow-up regarding CPAP therapy.  He wears his CPAP most nights.  Settle down with a nasal pillow mask.  Does still occasionally feel like he has some bothersome leaks but overall is doing better.  Unfortunately, he continues to have disruptions in his sleep at night.  Usually awake after an hour and then has difficulties going back to sleep.  Tends to still wake up frequently which has caused him to still have some daytime tiredness.  He does feel better than when he was not using his CPAP.  No issues with sleep parasomnia/paralysis.  No trouble  with drowsy driving.  Has tried over-the-counter sleep aids in the past without much success.  No issues with mood at the moment. 03/06/2024-04/04/2024: CPAP 5-15 cmH2O 30/30 days; 87% >4 hr; average use 6 hours 36 minutes Pressure 95th 7.7 leaks 95th 21.1 AHI 1.4  06/25/2024: Today - follow up Discussed the use of AI scribe software for clinical note transcription with the patient, who gave verbal consent to proceed.  History of Present Illness Ian Pineda is a 38 year old male with sleep apnea who presents for follow-up on CPAP therapy and sleep management.  He has been using the CPAP machine consistently with some improvement noted. He feels like the new settings have helped but he wonders if the pressures need to be a little bit higher. He wakes up out of breath, approximately once a week. No snoring, morning headaches. Energy is improved during the day. Drowsy driving improved.   He was previously prescribed Ambien  to aid with sleep but did not take it due to his wife's concerns about potential side effects. Instead, his PCP prescribed trazodone . Trazodone  has been effective in helping him sleep thus far. He notes that he is getting better rest at night and feels his energy levels are improved during the day, although his sleep can still be disrupted by his child.  05/23/2024-06/21/2024: CPAP 6-9 cmH2O 30/30 days; 83% >4 hr; average use 6 hr 19 minutes Pressure 95th 8 Leaks 95th 14.3 AHI 0.7    No Known Allergies Immunization History  Administered Date(s) Administered   DTP 08/19/1988, 12/30/1988, 04/06/1989, 12/21/1989, 07/13/1993   Influenza Split 08/19/2012, 08/20/2013, 09/24/2014   Influenza, Quadrivalent, Recombinant, Inj, Pf 09/26/2019   Influenza, Seasonal, Injecte, Preservative Fre 08/08/2023   Influenza,inj,Quad PF,6+ Mos 09/20/2018, 08/26/2021   Influenza-Unspecified 09/08/2015, 08/15/2020, 08/15/2022   MMR 12/21/1989, 07/13/1993   OPV 08/19/1988, 12/30/1988,  04/06/1989, 12/21/1989, 07/13/1993   Tdap 01/29/2016   Past Medical History:  Diagnosis Date   Back pain    GERD (gastroesophageal reflux disease)    Syncope    one episode worked up (cardiology and endocrinology).  felt to be related to hypoglycemia    Tobacco History: Social History   Tobacco Use  Smoking Status Former   Average packs/day: 0.5 packs/day for 7.0 years (3.5 ttl pk-yrs)   Types: Cigarettes   Start date: 2013  Smokeless Tobacco Never  Tobacco Comments   socially    Counseling given: Not Answered Tobacco comments: socially    Outpatient Medications Prior to Visit  Medication Sig Dispense Refill   esomeprazole  (NEXIUM ) 20 MG packet Take 20 mg by mouth daily before breakfast.     sildenafil  (VIAGRA ) 50 MG tablet Take 1 tablet (50 mg total) by mouth daily as needed for erectile dysfunction. 30 tablet 0   traZODone  (DESYREL ) 50 MG tablet 1/2 - 1 tablet q hs. 30 tablet 1  valsartan  (DIOVAN ) 160 MG tablet TAKE ONE TABLET BY MOUTH ONCE DAILY 30 tablet 1   No facility-administered medications prior to visit.     Review of Systems:   Constitution: No weight change, fevers, chills. +fatigue (improved) HEENT:  +nasal congestion  Resp: +snoring (without CPAP); shortness of breath with exertion (baseline). No excess mucus or change in color of mucus. No productive or non-productive. No hemoptysis. No wheezing.   GU: No nocturia Psych: No depression or anxiety. Mood stable. +sleep disturbance (improved)  Observations/Objective: Patient is well-developed, well-nourished in no acute distress. A&Ox3. Resting comfortably at home. Unlabored breathing. Speech is clear and coherent with logical content.   Assessment and Plan: Moderate obstructive sleep apnea Moderate OSA on CPAP.  Good compliance and excellent control.  He does receive benefit from use.  Leaks improved but feeling as though pressures are too low at times. Will increase him to 8-10 cmH2O. Aware of risks of  untreated sleep apnea.  Encouraged to continue using nightly.  Understands proper use/care device.  Safe driving practices reviewed.  Healthy weight loss encouraged.   Unfortunately still has sleep disruptions causing him to feel poorly refreshed.  Will address insomnia with pharmacological therapy.  Patient Instructions  Continue to use CPAP every night, minimum of 4-6 hours a night.  Change equipment as directed. Wash your tubing with warm soap and water daily, hang to dry. Wash humidifier portion weekly. Use bottled, distilled water and change daily Be aware of reduced alertness and do not drive or operate heavy machinery if experiencing this or drowsiness.  Exercise encouraged, as tolerated. Healthy weight management discussed.  Avoid or decrease alcohol consumption and medications that make you more sleepy, if possible. Notify if persistent daytime sleepiness occurs even with consistent use of PAP therapy.   Continue trazodone  At bedtime as needed for sleep. Take immediately before bed. Ensure you have 7-8 hours in the bed after taking. Monitor for any mood changes or changes in sleep habits. Stop and notify immediately if these occur. Do not drive or operate heavy machinery after taking. Do not take with alcohol or other sedating medications. Ensure you apply your CPAP within 5-10 minutes of taking to avoid falling asleep without it. May cause some morning grogginess or vivid dreams.    Follow up in 6 months via virtual visit with Katie Nila Winker,NP. If symptoms do not improve or worsen, please contact office for sooner follow up or seek emergency care.    Insomnia Sleep hygiene reviewed. Decided to hold on zolpidem . Started on trazodone  by PCP; effective thus far. Follow up with PCP for further refills. Advised to notify if this becomes ineffective/worsening insomniac symptoms occur.       I discussed the assessment and treatment plan with the patient. The patient was provided an opportunity  to ask questions and all were answered. The patient agreed with the plan and demonstrated an understanding of the instructions.   The patient was advised to call back or seek an in-person evaluation if the symptoms worsen or if the condition fails to improve as anticipated.  I provided 31 minutes of non-face-to-face time during this encounter.   Comer LULLA Rouleau, NP

## 2024-06-25 NOTE — Patient Instructions (Addendum)
 Continue to use CPAP every night, minimum of 4-6 hours a night.  Change equipment as directed. Wash your tubing with warm soap and water daily, hang to dry. Wash humidifier portion weekly. Use bottled, distilled water and change daily Be aware of reduced alertness and do not drive or operate heavy machinery if experiencing this or drowsiness.  Exercise encouraged, as tolerated. Healthy weight management discussed.  Avoid or decrease alcohol consumption and medications that make you more sleepy, if possible. Notify if persistent daytime sleepiness occurs even with consistent use of PAP therapy.  Adjust CPAP settings   Continue trazodone  At bedtime as needed for sleep. Take immediately before bed. Ensure you have 7-8 hours in the bed after taking. Monitor for any mood changes or changes in sleep habits. Stop and notify immediately if these occur. Do not drive or operate heavy machinery after taking. Do not take with alcohol or other sedating medications. Ensure you apply your CPAP within 5-10 minutes of taking to avoid falling asleep without it. May cause some morning grogginess or vivid dreams.    Follow up in 6 months via virtual visit with Ian Treyvone Chelf,NP. If symptoms do not improve or worsen, please contact office for sooner follow up or seek emergency care.

## 2024-06-26 ENCOUNTER — Encounter: Payer: Self-pay | Admitting: Nurse Practitioner

## 2024-06-26 NOTE — Assessment & Plan Note (Signed)
 Moderate OSA on CPAP.  Good compliance and excellent control.  He does receive benefit from use.  Leaks improved but feeling as though pressures are too low at times. Will increase him to 8-10 cmH2O. Aware of risks of untreated sleep apnea.  Encouraged to continue using nightly.  Understands proper use/care device.  Safe driving practices reviewed.  Healthy weight loss encouraged.   Unfortunately still has sleep disruptions causing him to feel poorly refreshed.  Will address insomnia with pharmacological therapy.  Patient Instructions  Continue to use CPAP every night, minimum of 4-6 hours a night.  Change equipment as directed. Wash your tubing with warm soap and water daily, hang to dry. Wash humidifier portion weekly. Use bottled, distilled water and change daily Be aware of reduced alertness and do not drive or operate heavy machinery if experiencing this or drowsiness.  Exercise encouraged, as tolerated. Healthy weight management discussed.  Avoid or decrease alcohol consumption and medications that make you more sleepy, if possible. Notify if persistent daytime sleepiness occurs even with consistent use of PAP therapy.   Continue trazodone  At bedtime as needed for sleep. Take immediately before bed. Ensure you have 7-8 hours in the bed after taking. Monitor for any mood changes or changes in sleep habits. Stop and notify immediately if these occur. Do not drive or operate heavy machinery after taking. Do not take with alcohol or other sedating medications. Ensure you apply your CPAP within 5-10 minutes of taking to avoid falling asleep without it. May cause some morning grogginess or vivid dreams.    Follow up in 6 months via virtual visit with Katie Montel Vanderhoof,NP. If symptoms do not improve or worsen, please contact office for sooner follow up or seek emergency care.

## 2024-06-26 NOTE — Assessment & Plan Note (Addendum)
 Sleep hygiene reviewed. Decided to hold on zolpidem . Started on trazodone  by PCP; effective thus far. Follow up with PCP for further refills. Advised to notify if this becomes ineffective/worsening insomniac symptoms occur.

## 2024-08-13 ENCOUNTER — Encounter: Admitting: Internal Medicine

## 2024-08-18 ENCOUNTER — Other Ambulatory Visit: Payer: Self-pay | Admitting: Internal Medicine

## 2024-09-06 ENCOUNTER — Ambulatory Visit (INDEPENDENT_AMBULATORY_CARE_PROVIDER_SITE_OTHER): Admitting: Internal Medicine

## 2024-09-06 ENCOUNTER — Encounter: Payer: Self-pay | Admitting: Internal Medicine

## 2024-09-06 VITALS — BP 120/80 | HR 96 | Temp 98.1°F | Ht 71.5 in | Wt 290.2 lb

## 2024-09-06 DIAGNOSIS — Z0000 Encounter for general adult medical examination without abnormal findings: Secondary | ICD-10-CM | POA: Diagnosis not present

## 2024-09-06 DIAGNOSIS — R0981 Nasal congestion: Secondary | ICD-10-CM

## 2024-09-06 DIAGNOSIS — K219 Gastro-esophageal reflux disease without esophagitis: Secondary | ICD-10-CM | POA: Diagnosis not present

## 2024-09-06 DIAGNOSIS — I10 Essential (primary) hypertension: Secondary | ICD-10-CM

## 2024-09-06 DIAGNOSIS — G4733 Obstructive sleep apnea (adult) (pediatric): Secondary | ICD-10-CM | POA: Diagnosis not present

## 2024-09-06 DIAGNOSIS — F439 Reaction to severe stress, unspecified: Secondary | ICD-10-CM

## 2024-09-06 LAB — LIPID PANEL
Cholesterol: 118 mg/dL (ref 0–200)
HDL: 49.1 mg/dL (ref >39.00)
LDL Cholesterol: 52 mg/dL (ref 0–99)
NonHDL: 68.73
Total CHOL/HDL Ratio: 2
Triglycerides: 82 mg/dL (ref 0.0–149.0)
VLDL: 16.4 mg/dL (ref 0.0–40.0)

## 2024-09-06 LAB — CBC WITH DIFFERENTIAL/PLATELET
Basophils Absolute: 0.1 K/uL (ref 0.0–0.1)
Basophils Relative: 0.4 % (ref 0.0–3.0)
Eosinophils Absolute: 0.1 K/uL (ref 0.0–0.7)
Eosinophils Relative: 1 % (ref 0.0–5.0)
HCT: 44.5 % (ref 39.0–52.0)
Hemoglobin: 14.7 g/dL (ref 13.0–17.0)
Lymphocytes Relative: 8.9 % — ABNORMAL LOW (ref 12.0–46.0)
Lymphs Abs: 1.3 K/uL (ref 0.7–4.0)
MCHC: 33.1 g/dL (ref 30.0–36.0)
MCV: 86.8 fl (ref 78.0–100.0)
Monocytes Absolute: 0.8 K/uL (ref 0.1–1.0)
Monocytes Relative: 5.4 % (ref 3.0–12.0)
Neutro Abs: 11.9 K/uL — ABNORMAL HIGH (ref 1.4–7.7)
Neutrophils Relative %: 84.3 % — ABNORMAL HIGH (ref 43.0–77.0)
Platelets: 232 K/uL (ref 150.0–400.0)
RBC: 5.13 Mil/uL (ref 4.22–5.81)
RDW: 12.7 % (ref 11.5–15.5)
WBC: 14.1 K/uL — ABNORMAL HIGH (ref 4.0–10.5)

## 2024-09-06 LAB — BASIC METABOLIC PANEL WITH GFR
BUN: 10 mg/dL (ref 6–23)
CO2: 28 meq/L (ref 19–32)
Calcium: 9 mg/dL (ref 8.4–10.5)
Chloride: 101 meq/L (ref 96–112)
Creatinine, Ser: 0.93 mg/dL (ref 0.40–1.50)
GFR: 105.75 mL/min (ref >60.00)
Glucose, Bld: 124 mg/dL — ABNORMAL HIGH (ref 70–99)
Potassium: 4.1 meq/L (ref 3.5–5.1)
Sodium: 139 meq/L (ref 135–145)

## 2024-09-06 LAB — HEPATIC FUNCTION PANEL
ALT: 31 U/L (ref 0–53)
AST: 19 U/L (ref 0–37)
Albumin: 4.5 g/dL (ref 3.5–5.2)
Alkaline Phosphatase: 79 U/L (ref 39–117)
Bilirubin, Direct: 0.2 mg/dL (ref 0.0–0.3)
Total Bilirubin: 0.6 mg/dL (ref 0.2–1.2)
Total Protein: 6.9 g/dL (ref 6.0–8.3)

## 2024-09-06 MED ORDER — TRAZODONE HCL 50 MG PO TABS: ORAL_TABLET | ORAL | 0 refills | Status: AC

## 2024-09-06 MED ORDER — VALSARTAN 160 MG PO TABS: 160.0000 mg | ORAL_TABLET | Freq: Every day | ORAL | 1 refills | Status: AC

## 2024-09-06 NOTE — Progress Notes (Signed)
 Subjective:    Patient ID: Ian Pineda, male    DOB: 21-May-1986, 38 y.o.   MRN: [id number]  Patient here for  Chief Complaint  Patient presents with   Annual Exam    CPE   URI    C/O cold sx's x 3- 4 day. Cough, runny nose, PND, itchy throat, & pressure behind eyes    HPI Here for a physical exam. Continues on valsartan . Previous neck and arm issues. Some numbness third/fourth/fifth fingers - left hand - reported last visit. This has improved/resolved. Continues on nexium . Has had issues with sleep. Discussed trial of trazodone  last visit. Trazodone  working for him. Had f/u with pulmonary 06/25/24. F/u sleep apnea - using cpap. Does report that starting 3-4 days ago, he developed runny nose. Increased sinus pressure when first wakes up. Increased nasal congestion - yellow mucus production. No sore throat. Some drainage. No vomiting or diarrhea.    Past Medical History:  Diagnosis Date   Back pain    GERD (gastroesophageal reflux disease)    Syncope    one episode worked up (cardiology and endocrinology).  felt to be related to hypoglycemia   Past Surgical History:  Procedure Laterality Date   ANKLE SURGERY  2003   LEG SURGERY     WISDOM TOOTH EXTRACTION     Family History  Adopted: Yes   Social History   Socioeconomic History   Marital status: Married    Spouse name: Primary School Teacher   Number of children: 0   Years of education: 14   Highest education level: Associate degree: occupational, scientist, product/process development, or vocational program  Occupational History   Occupation: Associate professor  Tobacco Use   Smoking status: Former    Average packs/day: 0.5 packs/day for 7.0 years (3.5 ttl pk-yrs)    Types: Cigarettes    Start date: 2013   Smokeless tobacco: Never   Tobacco comments:    socially   Vaping Use   Vaping status: Never Used  Substance and Sexual Activity   Alcohol use: Yes    Alcohol/week: 0.0 standard drinks of alcohol    Comment: socially   Drug use: No   Sexual  activity: Yes  Other Topics Concern   Not on file  Social History Narrative   Not on file   Social Drivers of Health   Financial Resource Strain: Medium Risk (05/09/2024)   Overall Financial Resource Strain (CARDIA)    Difficulty of Paying Living Expenses: Somewhat hard  Food Insecurity: No Food Insecurity (05/09/2024)   Hunger Vital Sign    Worried About Running Out of Food in the Last Year: Never true    Ran Out of Food in the Last Year: Never true  Transportation Needs: No Transportation Needs (05/09/2024)   PRAPARE - Administrator, Civil Service (Medical): No    Lack of Transportation (Non-Medical): No  Physical Activity: Insufficiently Active (05/09/2024)   Exercise Vital Sign    Days of Exercise per Week: 2 days    Minutes of Exercise per Session: 20 min  Stress: No Stress Concern Present (05/09/2024)   [employer] of Occupational Health - Occupational Stress Questionnaire    Feeling of Stress: Only a little  Social Connections: Moderately Integrated (05/09/2024)   Social Connection and Isolation Panel    Frequency of Communication with Friends and Family: More than three times a week    Frequency of Social Gatherings with Friends and Family: Once a week    Attends Religious Services:  More than 4 times per year    Active Member of Clubs or Organizations: No    Attends Banker Meetings: Not on file    Marital Status: Married     Review of Systems  Constitutional:  Negative for appetite change, fever and unexpected weight change.  HENT:  Positive for congestion, postnasal drip and sinus pressure. Negative for sore throat.   Eyes:  Negative for pain and visual disturbance.  Respiratory:  Negative for cough, chest tightness and shortness of breath.   Cardiovascular:  Negative for chest pain, palpitations and leg swelling.  Gastrointestinal:  Negative for abdominal pain, diarrhea, nausea and vomiting.  Genitourinary:  Negative for difficulty  urinating and dysuria.  Musculoskeletal:  Negative for joint swelling and myalgias.  Skin:  Negative for color change and rash.  Neurological:  Negative for dizziness and headaches.  Hematological:  Negative for adenopathy. Does not bruise/bleed easily.  Psychiatric/Behavioral:  Negative for agitation and dysphoric mood.        Objective:     BP 120/80 (Cuff Size: Large)   Pulse 96   Temp 98.1 F (36.7 C) (Oral)   Ht 5' 11.5 (1.816 m)   Wt 290 lb 4 oz (131.7 kg)   SpO2 98%   BMI 39.92 kg/m  Wt Readings from Last 3 Encounters:  09/06/24 290 lb 4 oz (131.7 kg)  06/25/24 275 lb (124.7 kg)  05/09/24 286 lb (129.7 kg)    Physical Exam Constitutional:      General: He is not in acute distress.    Appearance: Normal appearance. He is well-developed.  HENT:     Head: Normocephalic and atraumatic.     Right Ear: External ear normal.     Left Ear: External ear normal.  Eyes:     General: No scleral icterus.       Right eye: No discharge.        Left eye: No discharge.     Conjunctiva/sclera: Conjunctivae normal.  Neck:     Thyroid : No thyromegaly.  Cardiovascular:     Rate and Rhythm: Normal rate and regular rhythm.  Pulmonary:     Effort: No respiratory distress.     Breath sounds: Normal breath sounds. No wheezing.  Abdominal:     General: Bowel sounds are normal.     Palpations: Abdomen is soft.     Tenderness: There is no abdominal tenderness.  Musculoskeletal:        General: No swelling or tenderness.     Cervical back: Neck supple. No tenderness.  Lymphadenopathy:     Cervical: No cervical adenopathy.  Skin:    Findings: No erythema or rash.  Neurological:     Mental Status: He is alert and oriented to person, place, and time.  Psychiatric:        Mood and Affect: Mood normal.        Behavior: Behavior normal.         Outpatient Encounter Medications as of 09/06/2024  Medication Sig   esomeprazole  (NEXIUM ) 20 MG packet Take 20 mg by mouth daily  before breakfast.   sildenafil  (VIAGRA ) 50 MG tablet Take 1 tablet (50 mg total) by mouth daily as needed for erectile dysfunction.   traZODone  (DESYREL ) 50 MG tablet 1/2 - 1 tablet q hs.   valsartan  (DIOVAN ) 160 MG tablet Take 1 tablet (160 mg total) by mouth daily.   [DISCONTINUED] traZODone  (DESYREL ) 50 MG tablet 1/2 - 1 tablet q hs.   [DISCONTINUED]  valsartan  (DIOVAN ) 160 MG tablet TAKE ONE TABLET BY MOUTH ONCE DAILY   No facility-administered encounter medications on file as of 09/06/2024.     Lab Results  Component Value Date   WBC 14.1 (H) 09/06/2024   HGB 14.7 09/06/2024   HCT 44.5 09/06/2024   PLT 232.0 09/06/2024   GLUCOSE 124 (H) 09/06/2024   CHOL 118 09/06/2024   TRIG 82.0 09/06/2024   HDL 49.10 09/06/2024   LDLCALC 52 09/06/2024   ALT 31 09/06/2024   AST 19 09/06/2024   NA 139 09/06/2024   K 4.1 09/06/2024   CL 101 09/06/2024   CREATININE 0.93 09/06/2024   BUN 10 09/06/2024   CO2 28 09/06/2024   TSH 1.18 05/09/2024   HGBA1C 5.5 01/29/2016    DG Chest 2 View Result Date: 02/20/2021 CLINICAL DATA:  Cough. EXAM: CHEST - 2 VIEW COMPARISON:  None. FINDINGS: The heart size and mediastinal contours are within normal limits. Both lungs are clear. No visible pleural effusions or pneumothorax. No acute osseous abnormality. IMPRESSION: No active cardiopulmonary disease. Electronically Signed   By: Gilmore GORMAN Molt MD   On: 02/20/2021 11:12       Assessment & Plan:  Routine general medical examination at a health care facility  Primary hypertension Assessment & Plan: Continue valsartan . Blood pressure as outlined. No change in medication. Follow pressures. Follow metabolic panel.   Orders: -     Lipid panel -     Hepatic function panel -     Basic metabolic panel with GFR -     CBC with Differential/Platelet  Health care maintenance Assessment & Plan: Physical today  09/06/24. Colonoscopy 05/23/22 - negative.  Recommended repeat age 56.     Moderate obstructive  sleep apnea Assessment & Plan: Continue cpap. Followed by pulmonary. Last f/u 06/25/24 - stable.    Gastroesophageal reflux disease, unspecified whether esophagitis present Assessment & Plan: Continues on nexium . No upper symptoms.    Stress Assessment & Plan: Stress at work. Overall appears to be handling things relatively well. Follow.    Nasal congestion Assessment & Plan: Increased nasal congestion and drainage as outlined. Treat with steroid nasal spray and saline nasal spray as outlined. Mucinex . Follow. Hold abx. Call with update.    Other orders -     traZODone  HCl; 1/2 - 1 tablet q hs.  Dispense: 90 tablet; Refill: 0 -     Valsartan ; Take 1 tablet (160 mg total) by mouth daily.  Dispense: 90 tablet; Refill: 1     Allena Hamilton, MD

## 2024-09-06 NOTE — Assessment & Plan Note (Signed)
 Continue cpap. Followed by pulmonary. Last f/u 06/25/24 - stable.

## 2024-09-06 NOTE — Assessment & Plan Note (Signed)
 Continues on nexium . No upper symptoms.

## 2024-09-06 NOTE — Patient Instructions (Signed)
 Nasacort nasal spray - 2 sprays each nostril one time per day. Do this in the evening.   Saline nasal spray - flush nose 1-2x/day.   Mucinex  daily if needed for congestion.

## 2024-09-06 NOTE — Assessment & Plan Note (Signed)
 Physical today  09/06/24. Colonoscopy 05/23/22 - negative.  Recommended repeat age 38.

## 2024-09-07 ENCOUNTER — Other Ambulatory Visit: Payer: Self-pay

## 2024-09-07 ENCOUNTER — Ambulatory Visit: Payer: Self-pay | Admitting: Internal Medicine

## 2024-09-07 DIAGNOSIS — R899 Unspecified abnormal finding in specimens from other organs, systems and tissues: Secondary | ICD-10-CM

## 2024-09-07 DIAGNOSIS — I10 Essential (primary) hypertension: Secondary | ICD-10-CM

## 2024-09-10 ENCOUNTER — Encounter: Payer: Self-pay | Admitting: Internal Medicine

## 2024-09-10 NOTE — Assessment & Plan Note (Signed)
 Stress at work. Overall appears to be handling things relatively well. Follow.

## 2024-09-10 NOTE — Assessment & Plan Note (Signed)
 Increased nasal congestion and drainage as outlined. Treat with steroid nasal spray and saline nasal spray as outlined. Mucinex . Follow. Hold abx. Call with update.

## 2024-09-10 NOTE — Assessment & Plan Note (Signed)
 Continue valsartan . Blood pressure as outlined. No change in medication. Follow pressures. Follow metabolic panel.

## 2024-09-24 ENCOUNTER — Other Ambulatory Visit (INDEPENDENT_AMBULATORY_CARE_PROVIDER_SITE_OTHER)

## 2024-09-24 DIAGNOSIS — I10 Essential (primary) hypertension: Secondary | ICD-10-CM | POA: Diagnosis not present

## 2024-09-24 DIAGNOSIS — R899 Unspecified abnormal finding in specimens from other organs, systems and tissues: Secondary | ICD-10-CM

## 2024-09-24 LAB — CBC WITH DIFFERENTIAL/PLATELET
Basophils Absolute: 0.1 K/uL (ref 0.0–0.1)
Basophils Relative: 0.8 % (ref 0.0–3.0)
Eosinophils Absolute: 0.2 K/uL (ref 0.0–0.7)
Eosinophils Relative: 3.1 % (ref 0.0–5.0)
HCT: 44.1 % (ref 39.0–52.0)
Hemoglobin: 15.1 g/dL (ref 13.0–17.0)
Lymphocytes Relative: 23.9 % (ref 12.0–46.0)
Lymphs Abs: 1.8 K/uL (ref 0.7–4.0)
MCHC: 34.1 g/dL (ref 30.0–36.0)
MCV: 85.9 fl (ref 78.0–100.0)
Monocytes Absolute: 0.5 K/uL (ref 0.1–1.0)
Monocytes Relative: 6.5 % (ref 3.0–12.0)
Neutro Abs: 5 K/uL (ref 1.4–7.7)
Neutrophils Relative %: 65.7 % (ref 43.0–77.0)
Platelets: 285 K/uL (ref 150.0–400.0)
RBC: 5.14 Mil/uL (ref 4.22–5.81)
RDW: 12.8 % (ref 11.5–15.5)
WBC: 7.6 K/uL (ref 4.0–10.5)

## 2024-09-25 ENCOUNTER — Ambulatory Visit: Payer: Self-pay | Admitting: Internal Medicine

## 2024-10-16 ENCOUNTER — Ambulatory Visit: Payer: Self-pay

## 2024-10-16 ENCOUNTER — Ambulatory Visit: Admitting: Nurse Practitioner

## 2024-10-16 ENCOUNTER — Encounter: Payer: Self-pay | Admitting: Internal Medicine

## 2024-10-16 ENCOUNTER — Ambulatory Visit: Attending: Nurse Practitioner

## 2024-10-16 VITALS — BP 133/89 | HR 119 | Temp 98.1°F | Ht 71.5 in | Wt 290.4 lb

## 2024-10-16 DIAGNOSIS — R000 Tachycardia, unspecified: Secondary | ICD-10-CM | POA: Diagnosis not present

## 2024-10-16 DIAGNOSIS — I10 Essential (primary) hypertension: Secondary | ICD-10-CM | POA: Diagnosis not present

## 2024-10-16 DIAGNOSIS — G4733 Obstructive sleep apnea (adult) (pediatric): Secondary | ICD-10-CM | POA: Diagnosis not present

## 2024-10-16 NOTE — Telephone Encounter (Signed)
 FYI Only or Action Required?: FYI only for provider: appointment scheduled on 10/17/2024.  Patient was last seen in primary care on 09/06/2024 by Glendia Shad, MD.  Called Nurse Triage reporting Palpitations.  Symptoms began x couple of days.  Interventions attempted: Nothing.  Symptoms are: unchanged.  Triage Disposition: See HCP Within 4 Hours (Or PCP Triage)  Patient/caregiver understands and will follow disposition?: Yes     Copied from CRM #[id number]. Topic: Clinical - Red Word Triage >> Oct 16, 2024 10:00 AM Ahlexyia S wrote: [facility] that prompted transfer to Nurse Triage: Pt called in stating that he has been having high blood pressure. Pt stated the last time he checked it was 148/95. Pt also mentioned his pulse has been high for a week or two. Warm transferred to nurse triage. Reason for Disposition  [1] Heart beating very rapidly (e.g., > 140 / minute) AND [2] NOT present now  (Exception: Only during exercise.)  Answer Assessment - Initial Assessment Questions 1. DESCRIPTION: Please describe your heart rate or heartbeat that you are having (e.g., fast/slow, regular/irregular, skipped or extra beats, palpitations)     125, 130, 115 2. ONSET: When did it start? (e.g., minutes, hours, days)      X couple days 3. DURATION: How long does it last (e.g., seconds, minutes, hours)     na 4. PATTERN Does it come and go, or has it been constant since it started?  Does it get worse with exertion?   Are you feeling it now?     constant 5. TAP: Using your hand, can you tap out what you are feeling on a chair or table in front of you, so that I can hear? Note: Not all patients can do this.       na 6. HEART RATE: Can you tell me your heart rate? How many beats in 15 seconds?  Note: Not all patients can do this.       na 7. RECURRENT SYMPTOM: Have you ever had this before? If Yes, ask: When was the last time? and What happened that time?      no 8. CAUSE:  What do you think is causing the palpitations?     unknown 9. CARDIAC HISTORY: Do you have any history of heart disease? (e.g., heart attack, angina, bypass surgery, angioplasty, arrhythmia)      no 10. OTHER SYMPTOMS: Do you have any other symptoms? (e.g., dizziness, chest pain, sweating, difficulty breathing)       Pounding/racing, sweating at time 11. PREGNANCY: Is there any chance you are pregnant? When was your last menstrual period?       Na Pt states he has 3 caffeine drinks a day and 1 cup of coffee.  Pt just completed amoxicillin  for strep throat & no other new medications at present time.  Protocols used: Heart Rate and Heartbeat Questions-A-AH

## 2024-10-16 NOTE — Progress Notes (Signed)
 Ian Glance, NP-C Phone: [phone]  Ian Pineda is a 38 y.o. male who presents today for elevated heart rate.  Discussed the use of AI scribe software for clinical note transcription with the patient, who gave verbal consent to proceed.  History of Present Illness   Ian Pineda is a 38 year old male with hypertension and sleep apnea who presents with episodes of elevated heart rate.  He has been experiencing episodes of elevated heart rate over the past few months, with more frequent monitoring in the last few days. His heart rate has been recorded as high as 135 beats per minute, with the lowest being 95. During a recent dental visit, his heart rate was 126, and previously it was 120 at a medical appointment.  He denies feeling anxious, stressed, or exerting himself during these episodes. He denies chest pain or pressure. No shortness of breath, palpitations, or lightheadedness, although he did experience an episode three nights ago where he woke up feeling his heart was 'beating out of my chest' and had difficulty breathing. This episode coincided with his CPAP mask coming off during sleep.  His heart rate fluctuates, sometimes reaching 125-130 beats per minute and rarely dropping below 100. He uses a CPAP machine for sleep apnea and is on trazodone  for sleep. He attributes recent poor sleep to having multiple family members staying over for Thanksgiving, which disrupted his sleep routine.  He is currently on valsartan  for hypertension, which was initially prescribed when his blood pressure was significantly elevated during a period of sleep deprivation and stress related to his daughter's sleep issues. Despite medication adjustments, his blood pressure readings have varied, with recent measurements at 135/88 and 150/100.  He consumes about three to four caffeinated beverages daily, primarily [facility], and acknowledges that recent poor sleep and holiday stress may have contributed  to his symptoms. He denies regular alcohol consumption, noting it is mostly limited to weekends.      Pulse Readings from Last 10 Encounters:  10/16/24 (!) 119  09/06/24 96  05/09/24 90  12/14/23 89  08/26/23 (!) 105  08/08/23 94  06/15/23 (!) 101  04/07/23 88  01/06/23 100  02/22/22 (!) 106   Social History   Tobacco Use  Smoking Status Former   Average packs/day: 0.5 packs/day for 7.0 years (3.5 ttl pk-yrs)   Types: Cigarettes   Start date: 2013  Smokeless Tobacco Never  Tobacco Comments   socially     Current Outpatient Medications on File Prior to Visit  Medication Sig Dispense Refill   esomeprazole  (NEXIUM ) 20 MG packet Take 20 mg by mouth daily before breakfast.     traZODone  (DESYREL ) 50 MG tablet 1/2 - 1 tablet q hs. 90 tablet 0   valsartan  (DIOVAN ) 160 MG tablet Take 1 tablet (160 mg total) by mouth daily. 90 tablet 1   sildenafil  (VIAGRA ) 50 MG tablet Take 1 tablet (50 mg total) by mouth daily as needed for erectile dysfunction. (Patient not taking: Reported on 10/16/2024) 30 tablet 0   No current facility-administered medications on file prior to visit.     ROS see history of present illness  Objective  Physical Exam Vitals:   10/16/24 1438  BP: 133/89  Pulse: (!) 119  Temp: 98.1 F (36.7 C)  SpO2: 98%    BP Readings from Last 3 Encounters:  10/16/24 133/89  09/06/24 120/80  06/25/24 130/75   Wt Readings from Last 3 Encounters:  10/16/24 290 lb 6.4 oz (131.7  kg)  09/06/24 290 lb 4 oz (131.7 kg)  06/25/24 275 lb (124.7 kg)    Physical Exam Constitutional:      General: He is not in acute distress.    Appearance: Normal appearance.  HENT:     Head: Normocephalic.  Cardiovascular:     Rate and Rhythm: Regular rhythm. Tachycardia present.     Heart sounds: Normal heart sounds.  Pulmonary:     Effort: Pulmonary effort is normal.     Breath sounds: Normal breath sounds.  Skin:    General: Skin is warm and dry.  Neurological:      General: No focal deficit present.     Mental Status: He is alert.  Psychiatric:        Mood and Affect: Mood normal.        Behavior: Behavior normal.      Assessment/Plan: Please see individual problem list.  Tachycardia Assessment & Plan: He experiences episodes of elevated heart rate, but no chest pain, shortness of breath, palpitations, or lightheadedness. Caffeine, stress, and lack of sleep may contribute. EKG shows sinus tachycardia, no other acute findings. A Zio monitor is ordered for 14 days for further evaluation. Check lab work as outlined. He is advised to reduce caffeine intake, stay hydrated, and ensure adequate sleep. He should seek medical attention if his heart rate is remaining significantly elevated for a prolonged period of time or if he experiences chest pain, shortness of breath, or dizziness. We will continue to monitor. Further work up pending results.   Orders: -     EKG 12-Lead -     LONG TERM MONITOR (3-14 DAYS); Future -     TSH -     Basic metabolic panel with GFR  Primary hypertension Assessment & Plan: His blood pressure readings are variable, currently at 133/89 mmHg. He is on valsartan  with previous dose adjustments. Stress, caffeine, and lack of sleep may influence his blood pressure. He should continue valsartan  and monitor his blood pressure regularly. Consideration of beta blocker therapy is pending cardiac monitor results.   Moderate obstructive sleep apnea Assessment & Plan: His condition is managed with CPAP. Stress and lack of sleep may contribute to symptoms. He should ensure consistent use of the CPAP machine and address sleep hygiene and stress management. Continue Trazodone  at bedtime as needed.       Return if symptoms worsen or fail to improve.   Ian Glance, NP-C [facility]

## 2024-10-16 NOTE — Telephone Encounter (Signed)
Evaluated today.

## 2024-10-16 NOTE — Assessment & Plan Note (Signed)
 His blood pressure readings are variable, currently at 133/89 mmHg. He is on valsartan  with previous dose adjustments. Stress, caffeine, and lack of sleep may influence his blood pressure. He should continue valsartan  and monitor his blood pressure regularly. Consideration of beta blocker therapy is pending cardiac monitor results.

## 2024-10-16 NOTE — Assessment & Plan Note (Signed)
 His condition is managed with CPAP. Stress and lack of sleep may contribute to symptoms. He should ensure consistent use of the CPAP machine and address sleep hygiene and stress management. Continue Trazodone  at bedtime as needed.

## 2024-10-16 NOTE — Assessment & Plan Note (Signed)
 He experiences episodes of elevated heart rate, but no chest pain, shortness of breath, palpitations, or lightheadedness. Caffeine, stress, and lack of sleep may contribute. EKG shows sinus tachycardia, no other acute findings. A Zio monitor is ordered for 14 days for further evaluation. Check lab work as outlined. He is advised to reduce caffeine intake, stay hydrated, and ensure adequate sleep. He should seek medical attention if his heart rate is remaining significantly elevated for a prolonged period of time or if he experiences chest pain, shortness of breath, or dizziness. We will continue to monitor. Further work up pending results.

## 2024-10-17 ENCOUNTER — Ambulatory Visit: Payer: Self-pay | Admitting: Nurse Practitioner

## 2024-10-17 LAB — BASIC METABOLIC PANEL WITH GFR
BUN: 13 mg/dL (ref 6–23)
CO2: 25 meq/L (ref 19–32)
Calcium: 9.5 mg/dL (ref 8.4–10.5)
Chloride: 102 meq/L (ref 96–112)
Creatinine, Ser: 0.92 mg/dL (ref 0.40–1.50)
GFR: 107.05 mL/min (ref >60.00)
Glucose, Bld: 101 mg/dL — ABNORMAL HIGH (ref 70–99)
Potassium: 4 meq/L (ref 3.5–5.1)
Sodium: 138 meq/L (ref 135–145)

## 2024-10-17 LAB — TSH: TSH: 0.9 u[IU]/mL (ref 0.35–5.50)

## 2024-11-25 DIAGNOSIS — R000 Tachycardia, unspecified: Secondary | ICD-10-CM | POA: Diagnosis not present

## 2025-01-08 ENCOUNTER — Ambulatory Visit: Admitting: Internal Medicine
